# Patient Record
Sex: Female | Born: 1970
Health system: Southern US, Community
[De-identification: ages and names within clinical notes are randomized; demographics above are authoritative.]

## PROBLEM LIST (undated history)

## (undated) ENCOUNTER — Encounter

## (undated) ENCOUNTER — Ambulatory Visit

## (undated) DIAGNOSIS — R51 Headache: Secondary | ICD-10-CM

## (undated) DIAGNOSIS — K900 Celiac disease: Secondary | ICD-10-CM

## (undated) DIAGNOSIS — R519 Headache, unspecified: Secondary | ICD-10-CM

## (undated) DIAGNOSIS — M1990 Unspecified osteoarthritis, unspecified site: Secondary | ICD-10-CM

## (undated) DIAGNOSIS — C439 Malignant melanoma of skin, unspecified: Secondary | ICD-10-CM

## (undated) DIAGNOSIS — E119 Type 2 diabetes mellitus without complications: Secondary | ICD-10-CM

## (undated) DIAGNOSIS — G8929 Other chronic pain: Secondary | ICD-10-CM

## (undated) DIAGNOSIS — K219 Gastro-esophageal reflux disease without esophagitis: Secondary | ICD-10-CM

## (undated) HISTORY — DX: Headache, unspecified: R51.9

## (undated) HISTORY — DX: Other chronic pain: G89.29

## (undated) HISTORY — DX: Gastro-esophageal reflux disease without esophagitis: K21.9

## (undated) HISTORY — PX: LIPOSUCTION: SHX10

## (undated) HISTORY — PX: TUBAL LIGATION: SHX77

## (undated) HISTORY — DX: Unspecified osteoarthritis, unspecified site: M19.90

## (undated) HISTORY — DX: Celiac disease: K90.0

## (undated) HISTORY — DX: Malignant melanoma of skin, unspecified: C43.9

## (undated) HISTORY — DX: Headache: R51

## (undated) HISTORY — PX: BREAST REDUCTION SURGERY: SHX8

---

## 2003-08-19 ENCOUNTER — Ambulatory Visit (HOSPITAL_COMMUNITY): Admission: RE | Admit: 2003-08-19 | Discharge: 2003-08-20 | Payer: Self-pay | Admitting: Specialist

## 2003-08-19 ENCOUNTER — Encounter (INDEPENDENT_AMBULATORY_CARE_PROVIDER_SITE_OTHER): Payer: Self-pay | Admitting: *Deleted

## 2005-06-13 ENCOUNTER — Ambulatory Visit: Payer: Self-pay | Admitting: Family Medicine

## 2005-06-27 ENCOUNTER — Ambulatory Visit: Payer: Self-pay | Admitting: Sports Medicine

## 2005-07-09 ENCOUNTER — Ambulatory Visit: Payer: Self-pay | Admitting: Family Medicine

## 2005-07-23 ENCOUNTER — Ambulatory Visit (HOSPITAL_COMMUNITY): Admission: RE | Admit: 2005-07-23 | Discharge: 2005-07-23 | Payer: Self-pay | Admitting: General Surgery

## 2005-08-02 ENCOUNTER — Encounter: Admission: RE | Admit: 2005-08-02 | Discharge: 2005-08-02 | Payer: Self-pay | Admitting: General Surgery

## 2005-08-05 ENCOUNTER — Encounter (INDEPENDENT_AMBULATORY_CARE_PROVIDER_SITE_OTHER): Payer: Self-pay | Admitting: Specialist

## 2005-08-05 ENCOUNTER — Ambulatory Visit (HOSPITAL_BASED_OUTPATIENT_CLINIC_OR_DEPARTMENT_OTHER): Admission: RE | Admit: 2005-08-05 | Discharge: 2005-08-05 | Payer: Self-pay | Admitting: General Surgery

## 2005-11-09 ENCOUNTER — Encounter: Admission: RE | Admit: 2005-11-09 | Discharge: 2005-11-09 | Payer: Self-pay | Admitting: Internal Medicine

## 2006-02-04 DIAGNOSIS — K900 Celiac disease: Secondary | ICD-10-CM

## 2006-02-04 HISTORY — DX: Celiac disease: K90.0

## 2006-08-27 ENCOUNTER — Encounter: Payer: Self-pay | Admitting: Family Medicine

## 2006-08-27 ENCOUNTER — Telehealth (INDEPENDENT_AMBULATORY_CARE_PROVIDER_SITE_OTHER): Payer: Self-pay | Admitting: *Deleted

## 2006-08-27 ENCOUNTER — Ambulatory Visit: Payer: Self-pay | Admitting: Family Medicine

## 2007-02-06 ENCOUNTER — Encounter: Payer: Self-pay | Admitting: Family Medicine

## 2007-03-22 ENCOUNTER — Encounter: Payer: Self-pay | Admitting: Family Medicine

## 2007-04-20 ENCOUNTER — Ambulatory Visit: Payer: Self-pay | Admitting: Family Medicine

## 2007-04-27 ENCOUNTER — Telehealth: Payer: Self-pay | Admitting: *Deleted

## 2007-04-28 ENCOUNTER — Telehealth: Payer: Self-pay | Admitting: *Deleted

## 2007-05-14 ENCOUNTER — Ambulatory Visit: Payer: Self-pay | Admitting: Family Medicine

## 2007-05-14 DIAGNOSIS — G4700 Insomnia, unspecified: Secondary | ICD-10-CM | POA: Insufficient documentation

## 2007-05-14 DIAGNOSIS — K219 Gastro-esophageal reflux disease without esophagitis: Secondary | ICD-10-CM | POA: Insufficient documentation

## 2007-06-18 ENCOUNTER — Ambulatory Visit: Payer: Self-pay | Admitting: Family Medicine

## 2007-06-18 ENCOUNTER — Encounter: Payer: Self-pay | Admitting: Family Medicine

## 2007-06-18 LAB — CONVERTED CEMR LAB
Cholesterol: 229 mg/dL — ABNORMAL HIGH (ref 0–200)
Creatinine, Ser: 0.65 mg/dL (ref 0.40–1.20)
Glucose, Bld: 117 mg/dL — ABNORMAL HIGH (ref 70–99)
Hemoglobin: 12.2 g/dL (ref 12.0–15.0)
MCHC: 32.3 g/dL (ref 30.0–36.0)
MCV: 91.3 fL (ref 78.0–100.0)
Platelets: 266 10*3/uL (ref 150–400)
Potassium: 4.2 meq/L (ref 3.5–5.3)
RBC: 4.14 M/uL (ref 3.87–5.11)
Sodium: 135 meq/L (ref 135–145)
Total Protein: 7.7 g/dL (ref 6.0–8.3)
WBC: 11.5 10*3/uL — ABNORMAL HIGH (ref 4.0–10.5)

## 2007-07-03 ENCOUNTER — Encounter: Payer: Self-pay | Admitting: Family Medicine

## 2008-08-04 ENCOUNTER — Encounter: Payer: Self-pay | Admitting: Family Medicine

## 2008-08-18 ENCOUNTER — Encounter: Payer: Self-pay | Admitting: Family Medicine

## 2008-10-14 ENCOUNTER — Encounter: Payer: Self-pay | Admitting: Family Medicine

## 2008-10-27 ENCOUNTER — Encounter: Payer: Self-pay | Admitting: Family Medicine

## 2008-10-27 ENCOUNTER — Ambulatory Visit: Payer: Self-pay | Admitting: Family Medicine

## 2008-10-27 LAB — CONVERTED CEMR LAB
Chlamydia, DNA Probe: NEGATIVE
Whiff Test: NEGATIVE

## 2008-10-28 ENCOUNTER — Encounter: Payer: Self-pay | Admitting: Family Medicine

## 2008-11-18 ENCOUNTER — Ambulatory Visit: Payer: Self-pay | Admitting: Family Medicine

## 2008-11-18 ENCOUNTER — Encounter: Payer: Self-pay | Admitting: Family Medicine

## 2008-11-25 ENCOUNTER — Ambulatory Visit: Payer: Self-pay | Admitting: Family Medicine

## 2008-12-12 ENCOUNTER — Encounter: Payer: Self-pay | Admitting: Family Medicine

## 2008-12-16 ENCOUNTER — Encounter: Payer: Self-pay | Admitting: Family Medicine

## 2009-01-03 ENCOUNTER — Encounter: Payer: Self-pay | Admitting: Family Medicine

## 2009-01-19 ENCOUNTER — Ambulatory Visit: Payer: Self-pay | Admitting: Family Medicine

## 2009-01-19 DIAGNOSIS — L851 Acquired keratosis [keratoderma] palmaris et plantaris: Secondary | ICD-10-CM | POA: Insufficient documentation

## 2009-01-19 DIAGNOSIS — J309 Allergic rhinitis, unspecified: Secondary | ICD-10-CM | POA: Insufficient documentation

## 2009-01-19 DIAGNOSIS — R0982 Postnasal drip: Secondary | ICD-10-CM | POA: Insufficient documentation

## 2009-03-16 ENCOUNTER — Ambulatory Visit: Payer: Self-pay | Admitting: Family Medicine

## 2009-03-16 DIAGNOSIS — B9789 Other viral agents as the cause of diseases classified elsewhere: Secondary | ICD-10-CM | POA: Insufficient documentation

## 2009-08-03 ENCOUNTER — Encounter: Payer: Self-pay | Admitting: Family Medicine

## 2009-08-08 ENCOUNTER — Ambulatory Visit: Payer: Self-pay | Admitting: Family Medicine

## 2009-08-08 DIAGNOSIS — R030 Elevated blood-pressure reading, without diagnosis of hypertension: Secondary | ICD-10-CM | POA: Insufficient documentation

## 2009-08-08 DIAGNOSIS — N760 Acute vaginitis: Secondary | ICD-10-CM | POA: Insufficient documentation

## 2009-08-08 LAB — CONVERTED CEMR LAB

## 2009-08-09 LAB — CONVERTED CEMR LAB: GC Probe Amp, Genital: NEGATIVE

## 2010-02-07 ENCOUNTER — Encounter: Payer: Self-pay | Admitting: Family Medicine

## 2010-02-25 ENCOUNTER — Encounter: Payer: Self-pay | Admitting: Internal Medicine

## 2010-03-07 NOTE — Assessment & Plan Note (Signed)
Summary: COUGH/FEVER/KH   Vital Signs:  Patient profile:   40 year old female Height:      63 inches Weight:      171 pounds BMI:     30.40 BSA:     1.81 O2 Sat:      96 % on Room air Temp:     99.7 degrees F Pulse rate:   113 / minute BP sitting:   122 / 79  Vitals Entered By: Jone Baseman CMA (March 16, 2009 9:42 AM)  O2 Flow:  Room air CC: cough and fever x 1 day Pain Assessment Patient in pain? yes     Location: arms and legs Intensity: 5   Primary Care Provider:  Pearlean Brownie MD  CC:  cough and fever x 1 day.  History of Present Illness: Fever Cough  For last 24 hours.  Her son had a similar illness that lasted 5 days.  Yesterday she had fever and body aches today cough and some sputum.  No focal pain or shortness of breath or dysuria or stiff neck or rash.    PMH Has rheumatolgic condition on immunosupressants   ROS - as above PMH - Medications reviewed and updated in medication list.  Smoking Status noted in VS form    Habits & Providers  Alcohol-Tobacco-Diet     Tobacco Status: never  Current Medications (verified): 1)  Temazepam 15 Mg Caps (Temazepam) .Marland Kitchen.. 1 Mouth At Bedtime For Sleep For Occaisional Use Only 2)  Prednisone 5 Mg Tabs (Prednisone) .Marland Kitchen.. 1 Daily By Dr Jimmy Footman 3)  Methotrexate 2.5 Mg Tabs (Methotrexate Sodium) .... 9 Daily By Dr Jimmy Footman 4)  Folic Acid 1 Mg Tabs (Folic Acid) .Marland Kitchen.. 1 Daily By Dr Jimmy Footman 5)  Nasonex 50 Mcg/act Susp (Mometasone Furoate) .Marland Kitchen.. 1-2 Each Nostril Qd  Allergies: No Known Drug Allergies  Physical Exam  General:  Well-developed,well-nourished,in no acute distress; alert,appropriate and cooperative throughout examination Ears:  External ear exam shows no significant lesions or deformities.  Otoscopic examination reveals clear canals, tympanic membranes are intact bilaterally without bulging, retraction, inflammation or discharge. Hearing is grossly normal bilaterally. Nose:  External nasal  examination shows no deformity or inflammation. Nasal mucosa are pink and moist without lesions small amount of clear discharge  Mouth:  Oral mucosa and oropharynx without lesions or exudates.  Teeth in good repair. Lungs:  Normal respiratory effort, chest expands symmetrically. Lungs are clear to auscultation, no crackles or wheezes. Heart:  Normal rate and regular rhythm. S1 and S2 normal without gallop, murmur, click, rub or other extra sounds. Abdomen:  Bowel sounds positive,abdomen soft and non-tender without masses, organomegaly or hernias noted. Skin:  Intact without suspicious lesions or rashes Cervical Nodes:  No lymphadenopathy noted   Impression & Recommendations:  Problem # 1:  VIRAL INFECTION, ACUTE (ICD-079.99)  could easily be influenza.  She has rheumatologic condition on immunosupressants and therefore will treat with Tamiflu.  No signs of any bacterial infection  Orders: [facility]- Est Level  3 ([address])  Complete Medication List: 1)  Temazepam 15 Mg Caps (Temazepam) .Marland Kitchen.. 1 mouth at bedtime for sleep for occaisional use only 2)  Prednisone 5 Mg Tabs (Prednisone) .Marland Kitchen.. 1 daily by dr zieminski 3)  Methotrexate 2.5 Mg Tabs (Methotrexate sodium) .... 9 daily by dr zieminski 4)  Folic Acid 1 Mg Tabs (Folic acid) .Marland Kitchen.. 1 daily by dr zieminski 5)  Nasonex 50 Mcg/act Susp (Mometasone furoate) .Marland Kitchen.. 1-2 each nostril qd 6)  Tamiflu 75 Mg Caps (Oseltamivir  phosphate) .Marland Kitchen.. 1 by mouth two times a day for 5 days  Other Orders: Pulse Oximetry- [facility] [id number])  Patient Instructions: 1)  Come back if not better in one week or if focal pain or if fever > 103  2)  Take tylenol or advill for aches and pains Prescriptions: TAMIFLU 75 MG CAPS (OSELTAMIVIR PHOSPHATE) 1 by mouth two times a day for 5 days  #10 x 0   Entered and Authorized by:   Pearlean Brownie MD   Signed by:   Pearlean Brownie MD on 03/16/2009   Method used:   [facility] to        [facility]  [address] [phone][facility] (retail)       [address]       [place], [place]  [address]       Ph: [phone] or [phone]       Fax: [phone]   RxID:   [id number]    Prevention & Chronic Care Immunizations   Influenza vaccine: Not documented    Tetanus booster: Not documented    Pneumococcal vaccine: Not documented  Other Screening   Pap smear: NEGATIVE FOR INTRAEPITHELIAL LESIONS OR MALIGNANCY.  (10/27/2008)   Pap smear due: 07/2008    Mammogram: Not documented   Smoking status: never  (03/16/2009)  Lipids   Total Cholesterol: 229  (06/18/2007)   LDL: 145  (06/18/2007)   LDL Direct: Not documented   HDL: 49  (06/18/2007)   Triglycerides: 173  (06/18/2007)

## 2010-03-07 NOTE — Assessment & Plan Note (Signed)
Summary: ? sinus inf,tcb   Vital Signs:  Patient profile:   40 year old female Height:      63 inches Weight:      178 pounds BMI:     31.65 Temp:     98.2 degrees F oral BP sitting:   140 / 80  (right arm) Cuff size:   regular  Vitals Entered By: Tessie Fass CMA (January 19, 2009 2:15 PM) CC: sinus pressure and drainage, dry skin Is Patient Diabetic? No Pain Assessment Patient in pain? yes     Location: head Intensity: 6   Primary Care Provider:  Pearlean Brownie MD  CC:  sinus pressure and drainage and dry skin.  History of Present Illness: Pleasant 40 year old female with:  1. Sinus Pressure: Comes and goes since Nov 25. + PMHx allergic rhinitis, taking Benadryl and Pseudophed to treat. Today, she c/o sinus pressure, frontal HA that is pounding (6/10), photophobia, phonophobia, slight blurry vision at times, mild sore throat, throaty cough, and PND. She denies ear pain, double vision, eye pain, CP, chest congestion, SOB, wheeze, chest cough, N/V, fever/chills. She endorses mild diarrhea but states that this is normal with her medications.  2. Dry Skin: Both hands, mild skin irritation - redness, worse during the cold months, she washes her hand often in hot water, uses a [facility] brand lotion. She is followed by Rheum, Rx MTX and Prednisone for vague arthropathy.   Habits & Providers  Alcohol-Tobacco-Diet     Tobacco Status: never  Current Medications (verified): 1)  Temazepam 15 Mg Caps (Temazepam) .Marland Kitchen.. 1 Mouth At Bedtime For Sleep For Occaisional Use Only 2)  Prednisone 5 Mg Tabs (Prednisone) .Marland Kitchen.. 1 Daily By Dr Jimmy Footman 3)  Methotrexate 2.5 Mg Tabs (Methotrexate Sodium) .... 9 Daily By Dr Jimmy Footman 4)  Folic Acid 1 Mg Tabs (Folic Acid) .Marland Kitchen.. 1 Daily By Dr Jimmy Footman 5)  Nasonex 50 Mcg/act Susp (Mometasone Furoate) .Marland Kitchen.. 1-2 Each Nostril Qd 6)  Amoxicillin 500 Mg Tabs (Amoxicillin) .... One By Mouth Two Times A Day X 10 Days 7)  Diflucan 150 Mg Tabs (Fluconazole) ....  Once Daily  Allergies (verified): No Known Drug Allergies  Past History:  Past medical, surgical, family and social histories (including risk factors) reviewed for relevance to current acute and chronic problems.  Past Medical History: Reviewed history from 06/18/2007 and no changes required. colonscopy 10/2005 wnl, EGD 10-29-05 -mild esopghagitis, normal small bowel biopsy - Dr Randa Evens Melanoma dx 06/2005 - Dr Irene Limbo, Wide Excision Dr Abbey Chatters Inflamatory Arthropathy - Dr Norina Buzzard Bilateral breast reduction surgery  Visual Changes work up by Dr Dennie Fetters? sent for record 06/17/07  Family History: Reviewed history from 05/14/2007 and no changes required. Family History Hypertension in her mother Family History Diabetes 1st degree relative in her father  Social History: Reviewed history from 05/14/2007 and no changes required. Lives with sons Christiane Ha 48 and Joshua 96. Not currently employedSmoking Status:  never  Review of Systems       Today, she c/o sinus pressure, frontal HA that is pounding (6/10), photophobia, phonophobia, slight blurry vision at times, mild sore throat, throaty cough, and PND. She denies ear pain, double vision, eye pain, CP, chest congestion, SOB, wheeze, chest cough, N/V, fever/chills. She endorses mild diarrhea but states that this is normal with her medications.  Physical Exam  General:  Well-developed, well-nourished, in no acute distress; alert, appropriate and cooperative throughout examination. Vitals reviewed. Head:  Normocephalic and atraumatic without obvious abnormalities.  Eyes:  No corneal or conjunctival inflammation noted. EOMI. Perrla. Vision grossly normal. Ears:  Otoscopic examination reveals clear canals, tympanic membranes are intact bilaterally without bulging, retraction, inflammation or discharge.  Nose:  Mucosal edema and pallor with thickened nasal discharge, L frontal sinus tenderness, L maxillary sinus tenderness, R frontal sinus  tenderness, and R maxillary sinus tenderness.   Mouth:  + PND with erythema along tracks. No exudates or enlarged tonsils. Neck:  Supple and cervical lymphadenopathy.   Lungs:  Normal respiratory effort, chest expands symmetrically. Lungs are clear to auscultation, no crackles or wheezes. Heart:  Normal rate and regular rhythm. S1 and S2 normal without gallop, murmur, click, rub or other extra sounds. Neurologic:  Cranial nerves II-XII intact.   Skin:  Bilateral hands dry skin with slight redness 2/2 irritation up to middle of forearms. No rash or open lesions. Psych:  Normally interactive, good eye contact.   Impression & Recommendations:  Problem # 1:  RHINOSINUSITIS, ACUTE (ICD-461.8) Assessment New  Rhinosinusitis > 2 weeks in patient on immunosuppressing medications. Will treat with Amoxicillin. Provided education and handout re: diagnosis and symptomatic treatment. Gave Red Flags for follow-up. Will also address rhinitis component or prevention (see below). Her updated medication list for this problem includes:    Nasonex 50 Mcg/act Susp (Mometasone furoate) .Marland Kitchen... 1-2 each nostril qd    Amoxicillin 500 Mg Tabs (Amoxicillin) ..... One by mouth two times a day x 10 days  Orders: [facility]- Est Level  3 ([address])  Problem # 2:  ALLERGIC RHINITIS (ICD-477.9) Assessment: New  Patient stated that Zyrtec and Claritin have NOT worked well in the past. She had good results with Nasonex. Will restart. Okay for patient to use Afrin for next 2-3 days for symtom relief.  Her updated medication list for this problem includes:    Nasonex 50 Mcg/act Susp (Mometasone furoate) .Marland Kitchen... 1-2 each nostril qd  Orders: [facility]- Est Level  3 (60454)  Problem # 3:  POSTNASAL DRIP (ICD-784.91) Assessment: New  See #2.  Orders: [facility]- Est Level  3 (99213)  Problem # 4:  DRY SKIN (ICD-701.1) Assessment: New Advised patient to switch to ointment or heavier cream based formula to protect against dry skin as  lotions may actually make dry skin worse due to the high water content (and evaporation). Wash water in warm water instead of hot. May wear gloves at night for extra rehydration.  Orders: [facility]- Est Level  3 ([address]11)  Complete Medication List: 1)  Temazepam 15 Mg Caps (Temazepam) .Marland Kitchen.. 1 mouth at bedtime for sleep for occaisional use only 2)  Prednisone 5 Mg Tabs (Prednisone) .Marland Kitchen.. 1 daily by dr zieminski 3)  Methotrexate 2.5 Mg Tabs (Methotrexate sodium) .... 9 daily by dr zieminski 4)  Folic Acid 1 Mg Tabs (Folic acid) .Marland Kitchen.. 1 daily by dr zieminski 5)  Nasonex 50 Mcg/act Susp (Mometasone furoate) .Marland Kitchen.. 1-2 each nostril qd 6)  Amoxicillin 500 Mg Tabs (Amoxicillin) .... One by mouth two times a day x 10 days 7)  Diflucan 150 Mg Tabs (Fluconazole) .... Once daily  Patient Instructions: 1)  How is sinusitis treated?  2)  Some sinus infections get better on their own. Some may need to be treated with an antibiotic (a medicine that kills germs). Here are some other things you can do to help a sinus infection:  3)  -Get plenty of rest. When you go to sleep, try lying on the side that is least congested (where you can breathe the best), because lying  down increases nasal congestion.  4)  -Sip hot liquids and drink plenty of fluids.  5)  -Apply moist heat by holding a hot, wet towel against your face or breathe in steam. (If you're inhaling steam, be sure to cover your entire face with a cloth or towel first, so you don't burn yourself.)  6)  -Rinse your nasal passages with salt water to remove excess mucus. Use over-the-counter saline nasal solutions or make your own salt water. Add 1/4 teaspoon of table salt to 1 cup of warm water. Mix well in a clean, empty squeeze bottle. Squirt the salt water into each side of your nose for several minutes three or four times a day.  7)  -Use over-the-counter medicines such as acetaminophen (Tylenol) for pain. Don't use aspirin if you're allergic to it or under age 52.    57)  -Talk with your doctor before using cold remedies. Some cold medicines can make a sinus infection worse by drying out mucous membranes and blocking sinus openings. Other medicines disturb your sleep, make you nervous or raise your blood pressure or pulse.  9)  -If you use a nose spray with a decongestant in it, don't use it for more than three days. If you use it for more than three days, your nasal swelling may get worse when you stop the medicine. Use a short-acting nasal decongestant (brand names: Neo-Synephrine, Afrin 4-Hour), since long-acting kinds (brand names: Dristan 12-hour, Afrin 12-hour) may slow healing.  Prescriptions: DIFLUCAN 150 MG TABS (FLUCONAZOLE) once daily  #1 x 0   Entered and Authorized by:   Helane Rima DO   Signed by:   Helane Rima DO on 01/19/2009   Method used:   [facility]        [facility]  [address] [phone]* (retail)       [address]       [place], [place]  [address]       Ph: [phone] or [phone]       Fax: [phone]   RxID:   [id number] AMOXICILLIN 500 MG TABS (AMOXICILLIN) one by mouth two times a day x 10 days  #20 x 0   Entered and Authorized by:   Helane Rima DO   Signed by:   Helane Rima DO on 01/19/2009   Method used:   [facility]        [facility]  [address] [phone]* (retail)       [address]       [place], [place]  [address]       Ph: [phone] or [phone]       Fax: [phone]   RxID:   [id number] NASONEX 50 MCG/ACT SUSP (MOMETASONE FUROATE) 1-2 each nostril qd  #1 x 3   Entered and Authorized by:   Helane Rima DO   Signed by:   Helane Rima DO on 01/19/2009   Method used:   [facility]        [facility]  [address] [phone]* (retail)       [address]       [place], [place]  [address]       Ph: [phone] or [phone]       Fax: [phone]   RxID:   [id number]   Prevention & Chronic Care Immunizations   Influenza vaccine: Not  documented    Tetanus booster: Not documented    Pneumococcal vaccine: Not documented  Other Screening   Pap smear: NEGATIVE FOR INTRAEPITHELIAL LESIONS OR MALIGNANCY.  (10/27/2008)   Pap smear due: 07/2008   Smoking status: never  (01/19/2009)  Lipids   Total Cholesterol: 229  (06/18/2007)   LDL: 145  (06/18/2007)   LDL Direct: Not documented   HDL: 49  (06/18/2007)   Triglycerides: 173  (06/18/2007)

## 2010-03-07 NOTE — Consult Note (Signed)
[facility]   Imported By: De Nurse 08/15/2009 16:33:56  _____________________________________________________________________  External Attachment:    Type:   Image     Comment:   External Document

## 2010-03-07 NOTE — Assessment & Plan Note (Signed)
Summary: female problem,tcb   Vital Signs:  Patient profile:   40 year old female Weight:      171 pounds BMI:     30.40 Temp:     98.6 degrees F oral Pulse rate:   92 / minute BP sitting:   142 / 88  (left arm)  Vitals Entered By: Jimmy Footman, cma  Serial Vital Signs/Assessments:  Time      Position  BP       Pulse  Resp  Temp     By                     112/70                         Sarah Swaziland MD  CC: vaginal itching and burning x 2wweks.  Is Patient Diabetic? No Pain Assessment Patient in pain? yes     Location: overy Intensity: 3 Type: aching Comments pt states that she has used 3 day monistat which has not helped   Primary Care Provider:  Pearlean Brownie MD  CC:  vaginal itching and burning x 2wweks. Marland Kitchen  History of Present Illness: .  First started having problems 1 and 1/2 weeks ago.  C/o itching, some discharge.  Some discomfort in ovaries.  No dysparunia, but feels some pain in ovaries.  No odor noted. Pt used  1 day of yeast infection treatment without relief, then  3 day of Monistat with some improvement.  Has new partner.  Not using condoms.  s/p BTL.  He reports he has no infections.  SHe would would like to be screened for gc/ca, but feels she does not need screenign for HIV or syphillis. [employer], broadcasting/film/video.   Denies problems with blood pressure.  Was seen by other MD last week and it was 120/80.  Habits & Providers  Alcohol-Tobacco-Diet     Tobacco Status: never  Current Medications (verified): 1)  Temazepam 15 Mg Caps (Temazepam) .Marland Kitchen.. 1 Mouth At Bedtime For Sleep For Occaisional Use Only 2)  Prednisone 5 Mg Tabs (Prednisone) .Marland Kitchen.. 1 Daily By Dr Jimmy Footman 3)  Methotrexate 2.5 Mg Tabs (Methotrexate Sodium) .... 9 Weekly By Dr Jimmy Footman 4)  Folic Acid 1 Mg Tabs (Folic Acid) .Marland Kitchen.. 1 Daily By Dr Jimmy Footman 5)  Fluconazole 150 Mg Tabs (Fluconazole) .... Take 1 By Mouth Now, Repeat in 72 Hours If No Improvement. 6)  Metronidazole 500 Mg Tabs  (Metronidazole) .Marland Kitchen.. 1 By Mouth Two Times A Day For 7 Days  Allergies (verified): No Known Drug Allergies  Past History:  Past medical, surgical, family and social histories (including risk factors) reviewed for relevance to current acute and chronic problems.  Past Medical History: Reviewed history from 06/18/2007 and no changes required. colonscopy 10/2005 wnl, EGD 10-29-05 -mild esopghagitis, normal small bowel biopsy - Dr Randa Evens Melanoma dx 06/2005 - Dr Irene Limbo, Wide Excision Dr Abbey Chatters Inflamatory Arthropathy - Dr Norina Buzzard Bilateral breast reduction surgery  Visual Changes work up by Dr Dennie Fetters? sent for record 06/17/07  Family History: Reviewed history from 05/14/2007 and no changes required. Family History Hypertension in her mother Family History Diabetes 1st degree relative in her father  Social History: Reviewed history from 05/14/2007 and no changes required. Lives with sons Christiane Ha 89 and Joshua 96. works as Company secretary.  Review of Systems       see HPI  Physical Exam  General:  Overweight. in  no acute distress; alert,appropriate and cooperative throughout examination.  Vitals noted. Genitalia:  + increased erythema external genitalia.  + thick discharge in vagina. Normal rugated vagina.  No cervical d/c.  Nl uterus and ovaries with slight adnexal TTP.  No CMT.  GC/CZ swabs done.  Wet prep done.   Impression & Recommendations:  Problem # 1:  VAGINITIS (ICD-616.10) Wet prep negative for yeast, but with evidence of BV.  Pt symptomatic for yeast infection, and may be partially treated with topical agents.  Will rx fluconazole 150, may repeat in 72 hours if needed.  When results of wet prep reviewed, pt also wanted to be treated for BV, so will send rx for metronidazole as well.  F/u if no improvement.  GC/CZ results pending. Her updated medication list for this problem includes:    Metronidazole 500 Mg Tabs (Metronidazole) .Marland Kitchen... 1 by mouth two  times a day for 7 days  Orders: Wet Prep- [facility] [phone]) GC/Chlamydia-[facility] ([id number]) [facility]- Est Level  3 ([address])  Problem # 2:  ELEVATED BLOOD PRESSURE WITHOUT DIAGNOSIS OF HYPERTENSION (ICD-796.2)  Initially slightly elevated.  Repeat BP fine.  No intervention at this time.  Orders: [facility]- Est Level  3 ([id number])  Complete Medication List: 1)  Temazepam 15 Mg Caps (Temazepam) .Marland Kitchen.. 1 mouth at bedtime for sleep for occaisional use only 2)  Prednisone 5 Mg Tabs (Prednisone) .Marland Kitchen.. 1 daily by dr zieminski 3)  Methotrexate 2.5 Mg Tabs (Methotrexate sodium) .... 9 weekly by dr zieminski 4)  Folic Acid 1 Mg Tabs (Folic acid) .Marland Kitchen.. 1 daily by dr zieminski 5)  Fluconazole 150 Mg Tabs (Fluconazole) .... Take 1 by mouth now, repeat in 72 hours if no improvement. 6)  Metronidazole 500 Mg Tabs (Metronidazole) .Marland Kitchen.. 1 by mouth two times a day for 7 days Prescriptions: METRONIDAZOLE 500 MG TABS (METRONIDAZOLE) 1 by mouth two times a day for 7 days  #14 x 0   Entered and Authorized by:   Sarah Swaziland MD   Signed by:   Sarah Swaziland MD on 08/08/2009   Method used:   [facility]        [facility]  [address] [phone]* (retail)       [address]       [place], [place]  [address]       Ph: [phone] or [phone]       Fax: [phone]   RxID:   [id number] FLUCONAZOLE 150 MG TABS (FLUCONAZOLE) Take 1 by mouth now, repeat in 72 hours if no improvement.  #2 x 0   Entered and Authorized by:   Sarah Swaziland MD   Signed by:   Sarah Swaziland MD on 08/08/2009   Method used:   [facility]  [address] [phone]* (retail)       [address]       [place], [place]  [address]       Ph: [phone] or [phone]       Fax: [phone]   RxID:   [id number]   Laboratory Results  Date/Time Received: August 08, 2009 12:12 PM  Date/Time Reported: August 08, 2009 12:23 PM   [employer] Source: vaginal WBC/hpf: rare Bacteria/hpf: 3+  Cocci Clue cells/hpf: few   Positive whiff Yeast/hpf: none Trichomonas/hpf: none Comments: Rod bacteria also present ...........test performed by...........Marland KitchenTerese Door, CMA

## 2010-03-08 NOTE — Consult Note (Signed)
[facility]   Imported By: De Nurse 02/19/2010 14:31:26  _____________________________________________________________________  External Attachment:    Type:   Image     Comment:   External Document

## 2010-06-04 ENCOUNTER — Ambulatory Visit (INDEPENDENT_AMBULATORY_CARE_PROVIDER_SITE_OTHER): Payer: BC Managed Care – PPO | Admitting: Family Medicine

## 2010-06-04 ENCOUNTER — Encounter: Payer: Self-pay | Admitting: Family Medicine

## 2010-06-04 ENCOUNTER — Other Ambulatory Visit (HOSPITAL_COMMUNITY)
Admission: RE | Admit: 2010-06-04 | Discharge: 2010-06-04 | Disposition: A | Discharge status: Home / Self Care | Payer: BC Managed Care – PPO | Source: Ambulatory Visit | Attending: Family Medicine | Admitting: Family Medicine

## 2010-06-04 VITALS — BP 156/92 | HR 73 | Temp 98.1°F | Ht 64.0 in | Wt 174.0 lb

## 2010-06-04 DIAGNOSIS — Z1159 Encounter for screening for other viral diseases: Secondary | ICD-10-CM

## 2010-06-04 DIAGNOSIS — Z23 Encounter for immunization: Secondary | ICD-10-CM

## 2010-06-04 DIAGNOSIS — Z124 Encounter for screening for malignant neoplasm of cervix: Secondary | ICD-10-CM

## 2010-06-04 DIAGNOSIS — R0602 Shortness of breath: Secondary | ICD-10-CM

## 2010-06-04 DIAGNOSIS — Z01419 Encounter for gynecological examination (general) (routine) without abnormal findings: Secondary | ICD-10-CM | POA: Insufficient documentation

## 2010-06-04 DIAGNOSIS — Z111 Encounter for screening for respiratory tuberculosis: Secondary | ICD-10-CM

## 2010-06-04 DIAGNOSIS — R030 Elevated blood-pressure reading, without diagnosis of hypertension: Secondary | ICD-10-CM

## 2010-06-04 DIAGNOSIS — Z0000 Encounter for general adult medical examination without abnormal findings: Secondary | ICD-10-CM

## 2010-06-04 MED ORDER — IPRATROPIUM BROMIDE 0.02 % IN SOLN: 0.5000 mg | RESPIRATORY_TRACT | Status: DC

## 2010-06-04 MED ORDER — ALBUTEROL SULFATE (2.5 MG/3ML) 0.083% IN NEBU: 2.5000 mg | INHALATION_SOLUTION | RESPIRATORY_TRACT | Status: DC

## 2010-06-04 NOTE — Assessment & Plan Note (Signed)
Mildly high today.  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  Relates it is usually well controlled at her rheumatologist office.  Will monitor

## 2010-06-04 NOTE — Patient Instructions (Signed)
I will call you if your tests are not good.  Otherwise I will send you a letter.  If you do not hear from me with in 2 weeks please call our office.     You will need your next Hep B shot in 1 month  Preventive Health Care: Exercise  30-45  minutes a day, 3-4 days a week. Walking is especially valuable in preventing Osteoporosis. Eat a low-fat diet with lots of fruits and vegetables, up to 7-9 servings per day.

## 2010-06-04 NOTE — Progress Notes (Signed)
  Subjective:    Patient ID: Natalie Brady, female    DOB: 06/21/1970, 40 y.o.   MRN: [id number]  HPI  Here for well woman exam.  No complaints.   Review of Systems Patient reports no vision/ hearing  changes, adenopathy,fever, weight change,  persistant / recurrent hoarseness , swallowing issues, chest pain,palpitations,edema,persistant /recurrent cough, hemoptysis, dyspnea( rest/ exertional/paroxysmal nocturnal), gastrointestinal bleeding(melena, rectal bleeding), abdominal pain, significant heartburn bowel changes,GU symptoms(dysuria, hematuria,pyuria, incontinence) ), Gyn symptoms(abnormal  bleeding , pain),  syncope, focal weakness, memory loss,numbness & tingling, skin/hair /nail changes,abnormal bruising or bleeding, anxiety,or depression.      Objective:   Physical Exam    Neck:  No deformities, thyromegaly, masses, or tenderness noted.   Supple with full range of motion without pain. Heart - Regular rate and rhythm.  No murmurs, gallops or rubs.    Lungs:  Normal respiratory effort, chest expands symmetrically. Lungs are clear to auscultation, no crackles or wheezes. Abdomen: soft and non-tender without masses, organomegaly or hernias noted.  No guarding or rebound Breasts: breasts appear normal, no suspicious masses, no skin or nipple changes or axillary nodes Extremities:  No cyanosis, edema, or deformity noted with good range of motion of all major joints.   Genitalia:  Normal introitus for age, no external lesions, no vaginal discharge, mucosa pink and moist, no vaginal or cervical lesions, no vaginal atrophy, no friaility or hemorrhage, normal uterus size and position, no adnexal masses or tenderness     Assessment & Plan:   Normal exam.   Discussed diet and exercise   She will be starting ENT classes therefore will get her immunizations and TB status current

## 2010-06-05 LAB — VARICELLA ZOSTER ANTIBODY, IGG: Varicella IgG: 0.56 {ISR}

## 2010-06-06 ENCOUNTER — Ambulatory Visit (INDEPENDENT_AMBULATORY_CARE_PROVIDER_SITE_OTHER): Payer: BC Managed Care – PPO | Admitting: *Deleted

## 2010-06-06 DIAGNOSIS — Z111 Encounter for screening for respiratory tuberculosis: Secondary | ICD-10-CM

## 2010-06-06 LAB — TB SKIN TEST: TB Skin Test: NEGATIVE mm

## 2010-06-06 NOTE — Progress Notes (Signed)
PPD negative-0 mm. 

## 2010-06-22 NOTE — Op Note (Signed)
NAMEMACKIE, GOON                            ACCOUNT NO.:  [id number]   MEDICAL RECORD NO.:  [id number]                   PATIENT TYPE:  OIB   LOCATION:  2550                                 FACILITY:  [facility]   PHYSICIAN:  Earvin Hansen L. Shon Hough, M.D.           DATE OF BIRTH:  29-May-1970   DATE OF PROCEDURE:  08/19/2003  DATE OF DISCHARGE:                                 OPERATIVE REPORT   PREOPERATIVE DIAGNOSIS:  Severe abdominal dermatochalasis with diastasis  recti and lipodystrophy.   POSTOPERATIVE DIAGNOSIS:  Severe abdominal dermatochalasis with diastasis  recti and lipodystrophy.   PROCEDURE:  Abdominoplasty with repair of severe diastasis recti,  liposuction to lipodystrophy areas of the right and left hip and thigh  areas.  The patient has lost approximately 60 pounds in the past and now has  increased sagging of the skin with intertriginous changes.   SURGEON:  Yaakov Guthrie. Shon Hough, M.D.   ANESTHESIA:  General anesthesia.   DESCRIPTION OF PROCEDURE:  Preoperatively, the patient was sat up and drawn  for the W-plasty incisions, then underwent general anesthesia and intubated  orally.  Prep was done to the abdominal, groin and thigh areas using  Hibiclens soaping solution and walled off with sterile towels and drapes so  as to make a sterile field.  A Foley catheter was placed sterilely.  After  this, 0.25% Xylocaine with epinephrine was injected locally, 150 mL to the  incision areas.  The incision was made across the W-plasty incision area  with #15 blade and carried down through the skin and subcutaneous tissue and  then using the Bovie unit on coagulation, we were able to dissect down  through Scarpa's fascia to underlying  __________ tissue overlying the  fascia.  Dissection was then carried up to the umbilicus.  The umbilicus was  then released on its pedicle and then the dissection was carried all the way  up to the xiphoid process as well as the right and left upper  quadrant  areas.  Hemostasis was again maintained with the Bovie unit on coagulation.  After this, the severe diastasis was plicated and repaired with a running #1  Prolene suture from the xiphoid process to the umbilicus and another one  from the umbilicus down to the suprapubic area.  The patient was then placed  in a jackknife position.  Large amounts of skin were removed, weighing over  4 pounds on each side, and then after proper hemostasis, the flaps were then  brought back into their anatomical locations and secured with 2-0 Monocryl  x2 layers and a running subcuticular stitch of 3-0 Monocryl.  An new opening  was made for the belly button and the belly button was brought back through  and then the area was then repaired with 2-0 Monocryl in the subcutaneous  plane x2 levels and then a running subcuticular stitch of 3-0 Monocryl  throughout the whole area.  The wound was drained with a large Hemovac  drain, which was placed in the depths of the wound and brought through the  pubic area and secured with 3-0 Prolene.  The wounds were cleansed, half-  inch Steri-Strips applied to all the areas, including Xeroform gauze, 4 x  4's, ABDs, Hypafix tape and abdominal binder support.  She withstood the  procedures very well and was taken to recovery in excellent condition.   ESTIMATED BLOOD LOSS:  150 mL for this portion.   COMPLICATIONS:  None.                                              Yaakov Guthrie. Shon Hough, M.D.   Cathie Hoops  D:  08/19/2003  T:  08/19/2003  Job:  [id number]

## 2010-06-22 NOTE — Op Note (Signed)
NAMEARLETA, OSTRUM                ACCOUNT NO.:  [id number]   MEDICAL RECORD NO.:  [id number]          PATIENT TYPE:  AMB   LOCATION:  DSC                          FACILITY:  [facility]   PHYSICIAN:  Adolph Pollack, M.D.DATE OF BIRTH:  08-02-1970   DATE OF PROCEDURE:  08/05/2005  DATE OF DISCHARGE:                                 OPERATIVE REPORT   PREOPERATIVE DIAGNOSIS:  Left shoulder melanoma.   POSTOPERATIVE DIAGNOSIS:  Left shoulder melanoma.   PROCEDURE:  1.  Blue dye injection.  2.  Left axillary sentinel lymph node biopsy.  3.  Wide local excision left shoulder melanoma.   SURGEON:  Adolph Pollack, M.D.   ANESTHESIA:  General plus 0.5% plain Marcaine for local effect.   INDICATIONS:  This is a 40 year old female who had a change in the left  shoulder mole.  It was excised and a superficial spreading melanoma with at  least a Clark's level 2 involvement was noted.  It was approximately 0.95 mm  however, slight tangential artifact was present so they were not sure that  this was the true thickness.  She now presents for the above procedure.  We  have discussed the procedure and the risks preoperatively.   TECHNIQUE:  She is seen in the holding area and left shoulder marked with my  initials.  She had previously had injection with technetium sulfur colloid  intradermally in the left shoulder.  She was brought to the operating room,  placed supine on the operating table and general anesthetic was  administered.  The left axillary area and shoulder were sterilely prepped  and draped.  Preoperative lymphoscintigraphy had demonstrated findings  compatible with a sentinel node in the left anterior axillary region.  Local  anesthetic was then infiltrated in the left anterior region and in the  anterior axillary line. An incision was made beginning in the anterior  axillary line and extending posteriorly and slightly anteriorly.  Blue dye  injection was performed with 1.5 mL  of Lymphazurin blue in four quadrants  around the previous excision site in the left shoulder.  The subcutaneous  tissue was dissected.  Using the NeoProbe, initially anteriorly I did not  identify anything except some scatter artifact from the injection site as  the areas were in fairly close proximity.  However deep in an anterior  axillary line, I began beating slightly increased counts.  I continued my  dissection deep and found a lymph node that was hot but not blue. This was  excised as well as the surrounding lymph node with it.  It was sent to  pathology.   Hemostasis was controlled with electrocautery.  No other hot spots in the  axilla were noted.  Once hemostasis was adequate, I loosely approximated the  subcutaneous tissue in the axilla with a running 3-0 Vicryl stitch.  The  skin was closed with a 4-0 Monocryl subcuticular stitch.  Steri-Strips were  applied.   Following this, I addressed the left shoulder area.  2 cm were marked from  the previous excision site.  I made a longitudinal  type incision elliptical  shape through full-thickness skin, subcutaneous tissue down to the fascia  using the scalpel and electrocautery and then excised this area using  electrocautery and sent it to pathology.  The bleeding was controlled with  electrocautery.  I did inject some local anesthetic into the skin, dermis  and subcutaneous tissues.  When hemostasis was adequate, I was able to  approximate this in a single layer using interrupted 3-0 nylon sutures.  Neosporin and a sterile dressing were applied to this wound and a sterile  dressing was applied to the left axillary wound.   She tolerated both procedures well without any apparent complications.  She  subsequently was taken to the recovery room in satisfactory condition.      Adolph Pollack, M.D.  Electronically Signed     TJR/MEDQ  D:  08/05/2005  T:  08/05/2005  Job:  [id number]

## 2010-06-22 NOTE — Op Note (Signed)
NAMECORY, Natalie Brady                            ACCOUNT NO.:  [id number]   MEDICAL RECORD NO.:  [id number]                   PATIENT TYPE:  OIB   LOCATION:  2550                                 FACILITY:  [facility]   PHYSICIAN:  Earvin Hansen L. Shon Hough, M.D.           DATE OF BIRTH:  04-22-1970   DATE OF PROCEDURE:  08/19/2003  DATE OF DISCHARGE:                                 OPERATIVE REPORT   INDICATION:  [age]-year-old lady who, on today, August 19, 2003, is  undergoing bilateral breast reductions.  The patient has increased back and  shoulder pain secondary to her large pendulous breasts and intertriginous  changes resistant to conservative treatment for over 6-8 months including  salves, ointments and talcs.  She also has worn a special bra to shoulder  areas to support that without success.  She is now being prepared for  bilateral breast reductions using the inferior pedicle technique.   PROCEDURE:  Inferior pedicle reduction mammoplasty.   SURGEON:  Yaakov Guthrie. Shon Hough, M.D.   ANESTHESIA:  General.   DESCRIPTION OF PROCEDURE:  Preoperatively, the patient was sat up and drawn  for the inferior pedicle reduction mammoplasty, remarking the nipple-areolar  complexes back to 20 cm from the suprasternal notch.  She underwent drawings  to the areas.  Next, general anesthesia was administered.  She underwent  prep with Hibiclens soaping solution and walled off with sterile towels and  drapes so as to make a sterile field.  Xylocaine 0.25% in 1:400,000  concentration was injected locally within the breast areas, a total of 150  mL per side.  After this, the wounds were scored with #15 blade.  The skin  over the inferior pedicles was de-epithelialized with #20 blades.  Medial  and lateral fatty dermal pedicles were excised down to underlying fascia  after proper hemostasis.  The keyhole areas were also debulked.  The lateral  areas had other excess accessory tissue removed and after  proper hemostasis,  the flaps were transposed and stayed with 3-0 Prolene.  Subcutaneous closure  was done with 2-0 Monocryl x2 layers and then the skin edges were  reapproximated with running subcuticular stitches of 3-0 Monocryl and 5-0  Monocryl throughout the inverted T.  The wounds were drained with #10 Blake  drains, which were placed in the depths of the wound and brought out through  the lateral-most portion of the incision and secured with 3-0 Prolene.  The  wounds were cleansed, Steri-Strips and soft dressings applied to all the  areas, including Xeroform, 4 x 4's, ABDs and Hypafix tape.  She withstood  the procedure very well and was taken to recovery in excellent condition.   ESTIMATED BLOOD LOSS:  Less than 150 mL.   COMPLICATIONS:  None.  Yaakov Guthrie. Shon Hough, M.D.    Cathie Hoops  D:  08/19/2003  T:  08/19/2003  Job:  [id number]

## 2010-07-03 ENCOUNTER — Ambulatory Visit (INDEPENDENT_AMBULATORY_CARE_PROVIDER_SITE_OTHER): Payer: BC Managed Care – PPO | Admitting: *Deleted

## 2010-07-03 DIAGNOSIS — Z23 Encounter for immunization: Secondary | ICD-10-CM

## 2010-07-03 MED ORDER — VARICELLA VIRUS VACCINE LIVE ~~LOC~~ INJ: 0.5000 mL | INJECTION | Freq: Once | SUBCUTANEOUS | Status: DC

## 2010-07-03 NOTE — Progress Notes (Signed)
Consulted Dr. Deirdre Priest regarding prednisone use and giving Varicella today and he advises patient is on low dose prednisone and will be fine to give Varicella vaccine today.

## 2010-09-24 ENCOUNTER — Encounter: Payer: Self-pay | Admitting: Gastroenterology

## 2010-10-23 ENCOUNTER — Encounter: Payer: Self-pay | Admitting: Gastroenterology

## 2010-10-23 ENCOUNTER — Ambulatory Visit (INDEPENDENT_AMBULATORY_CARE_PROVIDER_SITE_OTHER): Payer: BC Managed Care – PPO | Admitting: *Deleted

## 2010-10-23 ENCOUNTER — Other Ambulatory Visit (INDEPENDENT_AMBULATORY_CARE_PROVIDER_SITE_OTHER): Payer: BC Managed Care – PPO

## 2010-10-23 ENCOUNTER — Ambulatory Visit (INDEPENDENT_AMBULATORY_CARE_PROVIDER_SITE_OTHER): Payer: BC Managed Care – PPO | Admitting: Gastroenterology

## 2010-10-23 DIAGNOSIS — R109 Unspecified abdominal pain: Secondary | ICD-10-CM

## 2010-10-23 DIAGNOSIS — K900 Celiac disease: Secondary | ICD-10-CM

## 2010-10-23 DIAGNOSIS — Z23 Encounter for immunization: Secondary | ICD-10-CM

## 2010-10-23 NOTE — Patient Instructions (Signed)
You will have labs done today in our basement lab. You will start a gluten free diet after labs today and then repeat the celiac panel in 6 weeks (October 29) .   You do not need an appointment the lab is open from 730 am to 5 pm Monday - Friday. We will send for your records from Dr Randa Evens.

## 2010-10-23 NOTE — Progress Notes (Signed)
HPI: This is a   very pleasant 40 year old woman who is here with her husband today. She was told that she had celiac sprue about 4 years ago. This was based on blood work done by her rheumatologist as well as biopsies of her upper intestine by Dr. Randa Evens from [facility] gastroenterology. She also had a colonoscopy in 2008. I do not have any of those results at the time of this visit.  She believes she avoids most gluten in her diet but admits she eats half the times, she knows that gluten does get into her diet periodically. She has never sat down with a dietitian.  She is bothered by postprandial bloating, abdominal discomfort, belching, loose stools. Some of the discomfort during her right upper quadrant, she can push in that area and then will have belching. She tells me she ultrasound 2 years ago and was negative for gallstones.    Review of systems: Pertinent positive and negative review of systems were noted in the above HPI section.  All other review of systems was otherwise negative.   Past Medical History  Diagnosis Date  . Melanoma     2005 surgery   . Arthritis   . Chronic headaches   . GERD (gastroesophageal reflux disease)   . Celiac disease 2008    Past Surgical History  Procedure Date  . Cesarean section     93 96  . Breast reduction surgery   . Liposuction      reports that she has never smoked. She has never used smokeless tobacco. She reports that she does not drink alcohol or use illicit drugs.  family history includes Coronary artery disease in her maternal grandmother and paternal grandmother; Diabetes in her father and mother; Hypertension in her mother; and Stomach cancer in her paternal grandfather.    Current Medications, Allergies were all reviewed with the patient via [facility] electronic medical record system.    Physical Exam: BP 148/88  Pulse 80  Ht 5\' 4"  (1.626 m)  Wt 170 lb 3.2 oz (77.202 kg)  BMI 29.21 kg/m2  LMP  10/12/2010 Constitutional: generally well-appearing Psychiatric: alert and oriented x3 Eyes: extraocular movements intact Mouth: oral pharynx moist, no lesions Neck: supple no lymphadenopathy Cardiovascular: heart regular rate and rhythm Lungs: clear to auscultation bilaterally Abdomen: soft, nontender, nondistended, no obvious ascites, no peritoneal signs, normal bowel sounds Extremities: no lower extremity edema bilaterally Skin: no lesions on visible extremities    Assessment and plan: 40 y.o. female with  celiac sprue, current symptoms consistent with celiac sprue  Most likely her symptoms are celiac sprue related. We will get records sent from her previous gastroenterologist to evaluate that workup. She will get a celiac panel today as well as total IgA level. She will then try to absolutely avoid all gluten in her diet for about 6 weeks and we will repeat a celiac panel. 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

## 2010-10-25 ENCOUNTER — Other Ambulatory Visit: Payer: BC Managed Care – PPO

## 2010-10-25 LAB — CELIAC PANEL 10
Endomysial Screen: POSITIVE — AB
Gliadin IgA: 0.1 U/mL (ref <20)
Gliadin IgG: 9.6 U/mL (ref <20)
IgA: 26 mg/dL — ABNORMAL LOW (ref 69–380)
Tissue Transglut Ab: 10.8 U/mL (ref <20)

## 2010-10-25 LAB — IGA: IgA: <30 mg/dL — ABNORMAL LOW (ref 68–378)

## 2010-10-25 LAB — ENDOMYSIAL AB IGA TITER: Endomysial Titer: 1:20 {titer} — ABNORMAL HIGH

## 2010-10-26 LAB — GIARDIA/CRYPTOSPORIDIUM (EIA)
Cryptosporidium Screen (EIA): NEGATIVE
Giardia Screen (EIA): NEGATIVE

## 2010-10-30 ENCOUNTER — Other Ambulatory Visit: Payer: Self-pay | Admitting: Gastroenterology

## 2010-10-30 ENCOUNTER — Telehealth: Payer: Self-pay | Admitting: Gastroenterology

## 2010-10-30 DIAGNOSIS — K900 Celiac disease: Secondary | ICD-10-CM

## 2010-10-30 DIAGNOSIS — R197 Diarrhea, unspecified: Secondary | ICD-10-CM

## 2010-10-30 NOTE — Telephone Encounter (Signed)
Received copies from Dr. Randa Evens @ [facility],on 10/30/2010. Forwarded  7pages to Dr. Georgann Housekeeper review.

## 2010-11-12 ENCOUNTER — Telehealth: Payer: Self-pay | Admitting: Gastroenterology

## 2010-11-12 NOTE — Telephone Encounter (Signed)
[facility] EGD 11/2005 Dr. Randa Evens: "a few erosions in lower third of esophagus" otherwise normal.  Duodenum biopsied. Colonoscopy 10/2005 Dr. Randa Evens: "internal medium sized hemorrhoids" otherwise normal exmination to cecum.  Patty, Can you call [facility].  We need the biopsy reports from her EGD 2007.

## 2010-11-14 NOTE — Telephone Encounter (Signed)
Called for the path report she will fax

## 2010-11-15 NOTE — Telephone Encounter (Signed)
Results faxed and put on Dr Christella Hartigan desk

## 2010-11-20 ENCOUNTER — Telehealth: Payer: Self-pay | Admitting: Gastroenterology

## 2010-11-20 NOTE — Telephone Encounter (Signed)
Biopsies from duodenum, October 2007 by Dr. Vilinda Boehringer showed normal small bowel mucosa. No sign of celiac sprue  I am interested to see how her celiac sprue antibodies change with her complete avoidance of gluten.

## 2010-12-04 ENCOUNTER — Telehealth: Payer: Self-pay

## 2010-12-04 NOTE — Telephone Encounter (Signed)
Message copied by Donata Duff on Tue Dec 04, 2010 11:36 AM ------      Message from: Donata Duff      Created: Tue Oct 23, 2010 12:32 PM       Pt to have labs

## 2010-12-04 NOTE — Telephone Encounter (Signed)
Pt reminded to have labs done.

## 2010-12-24 ENCOUNTER — Telehealth: Payer: Self-pay

## 2010-12-24 NOTE — Telephone Encounter (Signed)
Pt notified to have labs 

## 2010-12-24 NOTE — Telephone Encounter (Signed)
Message copied by Donata Duff on Mon Dec 24, 2010  2:51 PM ------      Message from: Donata Duff      Created: Tue Oct 30, 2010  8:29 AM       PT TO GET LABS

## 2010-12-26 ENCOUNTER — Ambulatory Visit: Payer: BC Managed Care – PPO

## 2010-12-26 ENCOUNTER — Other Ambulatory Visit: Payer: Self-pay | Admitting: *Deleted

## 2010-12-26 DIAGNOSIS — K900 Celiac disease: Secondary | ICD-10-CM

## 2010-12-31 LAB — GLIA (IGA/G) + TTG IGA: Gliadin IgG: 6.2 U/mL (ref <20)

## 2011-01-03 ENCOUNTER — Other Ambulatory Visit: Payer: Self-pay | Admitting: Gastroenterology

## 2011-01-03 MED ORDER — METRONIDAZOLE 500 MG PO TABS: 500.0000 mg | ORAL_TABLET | Freq: Three times a day (TID) | ORAL | Status: AC

## 2011-01-08 ENCOUNTER — Ambulatory Visit: Payer: BC Managed Care – PPO | Admitting: *Deleted

## 2011-01-08 ENCOUNTER — Telehealth: Payer: Self-pay | Admitting: Family Medicine

## 2011-01-08 DIAGNOSIS — Z23 Encounter for immunization: Secondary | ICD-10-CM

## 2011-01-08 MED ORDER — HEPATITIS B VAC RECOMBINANT 20 MCG/ML IM INJ: 20.0000 ug | INJECTION | Freq: Once | INTRAMUSCULAR | Status: DC

## 2011-01-08 NOTE — Telephone Encounter (Signed)
Pt asking to speak with RN re: her immunizations, she is needing one but not sure which and cannot find her shot record.

## 2011-01-08 NOTE — Telephone Encounter (Signed)
Returned call to patient.  Due for 3rd Hep B injection.  Appt scheduled for today with nurse clinic.  Gaylene Brooks, RN

## 2011-01-16 ENCOUNTER — Ambulatory Visit: Payer: BC Managed Care – PPO | Admitting: Gastroenterology

## 2011-07-31 ENCOUNTER — Ambulatory Visit (HOSPITAL_COMMUNITY)
Admission: RE | Admit: 2011-07-31 | Discharge: 2011-07-31 | Disposition: A | Discharge status: Home / Self Care | Payer: BC Managed Care – PPO | Source: Ambulatory Visit | Attending: Family Medicine | Admitting: Family Medicine

## 2011-07-31 ENCOUNTER — Other Ambulatory Visit (HOSPITAL_COMMUNITY)
Admission: RE | Admit: 2011-07-31 | Discharge: 2011-07-31 | Disposition: A | Discharge status: Home / Self Care | Payer: BC Managed Care – PPO | Source: Ambulatory Visit | Attending: Family Medicine | Admitting: Family Medicine

## 2011-07-31 ENCOUNTER — Encounter: Payer: Self-pay | Admitting: Family Medicine

## 2011-07-31 ENCOUNTER — Ambulatory Visit (INDEPENDENT_AMBULATORY_CARE_PROVIDER_SITE_OTHER): Payer: BC Managed Care – PPO | Admitting: Family Medicine

## 2011-07-31 VITALS — BP 149/88 | HR 91 | Temp 98.8°F | Ht 63.75 in | Wt 169.0 lb

## 2011-07-31 DIAGNOSIS — N898 Other specified noninflammatory disorders of vagina: Secondary | ICD-10-CM | POA: Insufficient documentation

## 2011-07-31 DIAGNOSIS — R079 Chest pain, unspecified: Secondary | ICD-10-CM | POA: Insufficient documentation

## 2011-07-31 DIAGNOSIS — Z113 Encounter for screening for infections with a predominantly sexual mode of transmission: Secondary | ICD-10-CM | POA: Insufficient documentation

## 2011-07-31 DIAGNOSIS — N760 Acute vaginitis: Secondary | ICD-10-CM

## 2011-07-31 LAB — POCT WET PREP (WET MOUNT): Clue Cells Wet Prep Whiff POC: NEGATIVE

## 2011-07-31 MED ORDER — NITROGLYCERIN 0.4 MG SL SUBL: SUBLINGUAL_TABLET | SUBLINGUAL | Status: DC

## 2011-07-31 MED ORDER — PRAVASTATIN SODIUM 40 MG PO TABS: 40.0000 mg | ORAL_TABLET | Freq: Every day | ORAL | Status: DC

## 2011-07-31 NOTE — Patient Instructions (Addendum)
Start pravachol  Start aspirin 81 mg  See Dr. Deirdre Priest in a few weeks to discuss a possible referral to cardiology Be seen sooner if you have another chest pain episode   Angina Angina is discomfort caused by inadequate oxygen delivery to the heart muscle. Angina is a response to blockage or narrowing of the coronary arteries. It alerts you about a blood flow problem to your heart. New, more frequent, or severe angina is a warning signal for you to seek medical care right away.   CAUSES The coronary arteries supply blood and oxygen to the heart muscle. The heart muscle needs a constant supply of oxygen in order to pump blood to the body. These arteries often become blocked by hardened deposits of blood products (plaque) including fat and cholesterol. The following contribute to the formation of plaque:  High cholesterol.   High blood pressure.   Smoking.   Obesity.   Diabetes.  SYMPTOMS    The most common problem is a deep discomfort in the center of your chest. The discomfort may also be felt in, or move to your:   Arms (especially the left one).   Throat.   Jaw.   Back.   Upper stomach.   Angina may be brought on by exercise, emotional upset, heavy meals, or extremes of heat or cold.   It may resolve within 5 to 10 minutes after a period of rest.   Angina symptoms vary from person to person.   If you have angina and it is treated, it may resolve. If you feel it again, the symptoms may not be the same in both type and location.  DIAGNOSIS    Emergency room evaluation or hospital admission may be needed to determine if there are any blockages of your coronary arteries.   Blood tests, EKGs, and chest X-rays may be done.   Further testing may include a stress test or an angiogram.   A heart specialist (cardiologist) may be asked to assist with your evaluation.   Other conditions that may feel like angina, but are not a problem with the heart include:   Muscle  strain in the chest wall.   Blood clots in the lung.   Anxiety.   Acid reflux from the stomach.  RISKS AND COMPLICATIONS   Angina that is not treated or evaluated can lead to a decline in oxygen delivery to the heart muscle, a heart attack, or even death. TREATMENT   Depending on severity of the blockages and on other factors, angina may be treated with:  Lifestyle changes such as weight loss, stopping smoking, appropriate exercise, or low cholesterol and low salt diet.   Medications to control or to treat the risk factors for angina.   Procedures such as angioplasty or stent placement may allow the blockages to be opened without surgery.   Open heart surgery may be needed to bypass blocked arteries that cannot be treated by other methods.  HOME CARE INSTRUCTIONS    If your caregiver prescribed medication to control your angina, take them as directed. Report side effects. Do not stop medications or adjust the dosages on your own.   Regular exercise is good for you as long as it does not cause discomfort. Avoid activities that trigger attacks of angina. Walking is the best exercise. Do not begin any new type of exercise until you check with your caregiver.   You may still have a sexual relationship if it does not cause angina. Tell your caregiver  if it does.   Stop smoking. Do not use nicotine patches or gum until you check with your caregiver.   If you are overweight, you should lose weight. Eat a heart healthy diet that is low in fat and salt.   If your caregiver has given you a follow-up appointment, it is very important to keep that appointment. Not keeping the appointment could result in a heart attack, permanent heart damage, and disability. If there is any problem keeping the appointment, you must call back to this facility for assistance.  SEEK MEDICAL CARE IF:    Your angina seems to be occurring more frequently or seems to be lasting longer.   You are having problems that  you think may be side effects of the medicine you are taking.  SEEK IMMEDIATE MEDICAL CARE IF:    You have severe chest discomfort, especially if the pain is crushing or pressure-like and spreads to the arms, back, neck, or jaw.   You are sweating, feel sick to your stomach (nauseous), or have shortness of breath.   You have an attack of angina that does not get better after rest or taking your usual medicine.   You wake from sleep with chest pain.   You feel dizzy, faint, or experience extreme fatigue.   You have chest pain not typical of your usual angina. THIS IS AN EMERGENCY. Do not wait to see if the pain will go away. Get medical help at once. Call 911. DO NOT drive yourself to the hospital.  MAKE SURE YOU:    Understand these instructions.   Will watch your condition.   Will get help right away if you are not doing well or get worse.  Document Released: 01/21/2005 Document Revised: 01/10/2011 Document Reviewed: 08/25/2007 [facility] Patient Information 2012 [place], [place].

## 2011-07-31 NOTE — Assessment & Plan Note (Addendum)
Release of information signed for labs performed at [facility] medical last month.  EKG today showed NSR with no abnormalities.  One episode of exertional CP.  Usually can exercise without pain, but limited by knee pain.  Risk factors include elevated BP and hyperlipidemia.  Will start pravachol and ASA and give nitro.  Pt desires to wait for referal to cards until discussion with Dr. Deirdre Priest.

## 2011-08-01 ENCOUNTER — Encounter: Payer: Self-pay | Admitting: Family Medicine

## 2011-08-01 NOTE — Assessment & Plan Note (Signed)
Thick white discharge concerning for yeast. Will check for STDs today.

## 2011-08-01 NOTE — Progress Notes (Signed)
  Subjective:    Patient ID: Natalie Brady, female    DOB: 11-06-1970, 41 y.o.   MRN: [id number]  HPI Patient complains of vaginal discharge. She says this is thick and white. She does have minimal itching. She seems to have some odor as well. She says that her periods have been more irregular now. The last year, she is having a regular period every other month and spotting for a week in between. She is a new partner and her not using condoms but they are monogamous. She has a BTL for contraception. He is not having abdominal pain, nausea, vomiting.  Chest pain-yesterday patient experienced one episode of chest pain. She said that this came on when she was caring her trash can up a very small hill to the end of the road. She described it as sharp and stabbing, centrally located. This made her hands get sweaty. It lasted about 7 minutes and resolved with rest. She did not have any nausea with it. She has never experienced a sensation like this before. She does walk for exercise without chest pain but she is limited in her speed because of her arthritis in her knees. Currently she is not having any chest pain, shortness of breath, dizziness, headache, chills, fever.  Family history significant for no early heart attacks. However, both her mother and father had diabetes.   Review of Systems See above    Objective:   Physical Exam  Vital signs reviewed General appearance - alert, well appearing, and in no distress Heart - normal rate, regular rhythm, normal S1, S2, no murmurs, rubs, clicks or gallops Chest - clear to auscultation, no wheezes, rales or rhonchi, symmetric air entry, no tachypnea, retractions or cyanosis Abdomen - soft, nontender, nondistended, no masses or organomegaly GYN- external genetalia normal, without lesions.  Vagina normal color, rugations, with some white discharge.  Cervix normal color without lesions or discharge. Extremities - peripheral pulses normal, no pedal edema,  no clubbing or cyanosis       Assessment & Plan:

## 2011-08-02 ENCOUNTER — Encounter: Payer: Self-pay | Admitting: Family Medicine

## 2011-09-05 LAB — LIPID PANEL
Cholesterol: 264 mg/dL — AB (ref 0–200)
HDL: 58 mg/dL (ref 35–70)

## 2011-09-05 LAB — BASIC METABOLIC PANEL: Creatinine: 0.7 mg/dL (ref 0.5–1.1)

## 2011-09-05 LAB — CBC AND DIFFERENTIAL: Hemoglobin: 11.9 g/dL — AB (ref 12.0–16.0)

## 2011-09-09 ENCOUNTER — Encounter: Payer: Self-pay | Admitting: Family Medicine

## 2012-01-21 ENCOUNTER — Ambulatory Visit (INDEPENDENT_AMBULATORY_CARE_PROVIDER_SITE_OTHER): Payer: BC Managed Care – PPO | Admitting: Family Medicine

## 2012-01-21 VITALS — BP 142/94 | HR 89 | Temp 98.2°F | Ht 63.0 in | Wt 171.0 lb

## 2012-01-21 DIAGNOSIS — Z23 Encounter for immunization: Secondary | ICD-10-CM

## 2012-01-21 DIAGNOSIS — R030 Elevated blood-pressure reading, without diagnosis of hypertension: Secondary | ICD-10-CM

## 2012-01-21 DIAGNOSIS — J069 Acute upper respiratory infection, unspecified: Secondary | ICD-10-CM | POA: Insufficient documentation

## 2012-01-21 NOTE — Assessment & Plan Note (Signed)
Mild URI, resolving.  Advised symptomatic care.  Discussed usual course, follow-up if no improvement

## 2012-01-21 NOTE — Patient Instructions (Addendum)
Borderline high blood pressure.  Normal is less than 140/90.  If you notice blood pressures remain high, please schedule follow-up with primary doctor  For nasal congestion, try nasal sale spray or sinus irrigation such as NeilMed Sinus Rinse  Ok to go back to work- wash hands frequently, wear mask if coughing

## 2012-01-21 NOTE — Assessment & Plan Note (Signed)
Mild elevation, advised follow-up if remains high (works as EMT) and will recheck at subsequent  Visits with PCP

## 2012-01-21 NOTE — Progress Notes (Signed)
  Subjective:    Patient ID: Natalie Brady, female    DOB: Jul 25, 1970, 41 y.o.   MRN: [id number]  HPI 5 days of URI  Started with sneezing, then progressed to coughing 2 days ago.  One episode of vomiting, now resolved.  No fever or body aches.  + sore throat and mild nasal congestion.  No dyspnea.  No sick contacts-  a baby neighbor that came to visit  was sick.  I have reviewed patient's  PMH, FH, and Social history and Medications as related to this visit. Sig for RA, takes enbrel, patient states she was instructed to discontinue this while having acute illness.  Patient works at [employer] Review of Systems See Hpi     Objective:   Physical Exam GEN: Alert & Oriented, No acute distress HEENT: [place]/AT. EOMI, PERRLA, no conjunctival injection or scleral icterus.  Bilateral tympanic membranes intact without erythema or effusion.  .  Nares without edema or rhinorrhea.  Oropharynx is without erythema or exudates.  No anterior or posterior cervical lymphadenopathy.  Hoarse voice. CV:  Regular Rate & Rhythm, no murmur Respiratory:  Normal work of breathing, CTAB         Assessment & Plan:

## 2012-02-18 ENCOUNTER — Emergency Department (HOSPITAL_COMMUNITY)
Admission: EM | Admit: 2012-02-18 | Discharge: 2012-02-19 | Disposition: A | Discharge status: Home / Self Care | Payer: BC Managed Care – PPO | Attending: Emergency Medicine | Admitting: Emergency Medicine

## 2012-02-18 ENCOUNTER — Encounter (HOSPITAL_COMMUNITY): Payer: Self-pay | Admitting: Emergency Medicine

## 2012-02-18 DIAGNOSIS — R112 Nausea with vomiting, unspecified: Secondary | ICD-10-CM | POA: Insufficient documentation

## 2012-02-18 DIAGNOSIS — R197 Diarrhea, unspecified: Secondary | ICD-10-CM | POA: Insufficient documentation

## 2012-02-18 DIAGNOSIS — K219 Gastro-esophageal reflux disease without esophagitis: Secondary | ICD-10-CM | POA: Insufficient documentation

## 2012-02-18 DIAGNOSIS — R52 Pain, unspecified: Secondary | ICD-10-CM | POA: Insufficient documentation

## 2012-02-18 DIAGNOSIS — R34 Anuria and oliguria: Secondary | ICD-10-CM | POA: Insufficient documentation

## 2012-02-18 DIAGNOSIS — Z79899 Other long term (current) drug therapy: Secondary | ICD-10-CM | POA: Insufficient documentation

## 2012-02-18 DIAGNOSIS — Z8739 Personal history of other diseases of the musculoskeletal system and connective tissue: Secondary | ICD-10-CM | POA: Insufficient documentation

## 2012-02-18 DIAGNOSIS — Z85820 Personal history of malignant melanoma of skin: Secondary | ICD-10-CM | POA: Insufficient documentation

## 2012-02-18 DIAGNOSIS — Z8719 Personal history of other diseases of the digestive system: Secondary | ICD-10-CM | POA: Insufficient documentation

## 2012-02-18 NOTE — ED Notes (Signed)
Pt alert arrives from home, c/o body aches, nausea, emesis, denies fever, denies cough, resp even unlabored, skin pwd, states "projectile vomiting while at work"

## 2012-02-18 NOTE — ED Notes (Signed)
Pt states she has 5/10 pressure in chest, skin pwd, EKG ordered

## 2012-02-19 LAB — CBC WITH DIFFERENTIAL/PLATELET
Eosinophils Absolute: 0.1 10*3/uL (ref 0.0–0.7)
Eosinophils Relative: 1 % (ref 0–5)
Hemoglobin: 12.9 g/dL (ref 12.0–15.0)
Lymphocytes Relative: 5 % — ABNORMAL LOW (ref 12–46)
MCHC: 33.6 g/dL (ref 30.0–36.0)
Monocytes Absolute: 0.4 10*3/uL (ref 0.1–1.0)
Monocytes Relative: 3 % (ref 3–12)
Neutro Abs: 14.5 10*3/uL — ABNORMAL HIGH (ref 1.7–7.7)
Neutrophils Relative %: 92 % — ABNORMAL HIGH (ref 43–77)
RDW: 13.6 % (ref 11.5–15.5)
WBC: 15.7 10*3/uL — ABNORMAL HIGH (ref 4.0–10.5)

## 2012-02-19 LAB — URINE MICROSCOPIC-ADD ON

## 2012-02-19 LAB — URINALYSIS, ROUTINE W REFLEX MICROSCOPIC
Hgb urine dipstick: NEGATIVE
Nitrite: NEGATIVE
Specific Gravity, Urine: 1.025 (ref 1.005–1.030)
Urobilinogen, UA: 0.2 mg/dL (ref 0.0–1.0)
pH: 5 (ref 5.0–8.0)

## 2012-02-19 LAB — POCT I-STAT, CHEM 8
BUN: 18 mg/dL (ref 6–23)
Calcium, Ion: 0.98 mmol/L — ABNORMAL LOW (ref 1.12–1.23)
Creatinine, Ser: 0.6 mg/dL (ref 0.50–1.10)
Glucose, Bld: 134 mg/dL — ABNORMAL HIGH (ref 70–99)
Hemoglobin: 13.6 g/dL (ref 12.0–15.0)
Sodium: 140 mEq/L (ref 135–145)
TCO2: 25 mmol/L (ref 0–100)

## 2012-02-19 MED ORDER — ONDANSETRON HCL 4 MG/2ML IJ SOLN
4.0000 mg | Freq: Once | INTRAMUSCULAR | Status: AC
  Administered 2012-02-19: 4 mg via INTRAVENOUS
  Filled 2012-02-19: qty 2

## 2012-02-19 MED ORDER — SODIUM CHLORIDE 0.9 % IV BOLUS (SEPSIS)
1000.0000 mL | Freq: Once | INTRAVENOUS | Status: AC
  Administered 2012-02-19: 1000 mL via INTRAVENOUS

## 2012-02-19 MED ORDER — ONDANSETRON HCL 4 MG PO TABS: 4.0000 mg | ORAL_TABLET | Freq: Three times a day (TID) | ORAL | Status: DC | PRN

## 2012-02-19 NOTE — ED Provider Notes (Signed)
Medical screening examination/treatment/procedure(s) were performed by non-physician practitioner and as supervising physician I was immediately available for consultation/collaboration.  Olivia Mackie, MD 02/19/12 [phone]

## 2012-02-19 NOTE — ED Provider Notes (Signed)
History     CSN: [id number]  Arrival date & time 02/18/12  2223   First MD Initiated Contact with Patient 02/19/12 0056      Chief Complaint  Patient presents with  . Generalized Body Aches  . Emesis  . 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  Denies fever cough sore throat   Patient is a 42 y.o. female presenting with vomiting and diarrhea. The history is provided by the patient.  Emesis  This is a new problem. The current episode started 3 to 5 hours ago. The problem has been gradually worsening. The emesis has an appearance of bilious material. There has been no fever. Associated symptoms include diarrhea. Pertinent negatives include no chills, no cough, no fever and no myalgias. Risk factors include ill contacts.  Diarrhea The primary symptoms include nausea, vomiting and diarrhea. Primary symptoms do not include fever, myalgias or rash.  The illness does not include chills.    Past Medical History  Diagnosis Date  . Melanoma     2005 surgery   . Arthritis   . Chronic headaches   . GERD (gastroesophageal reflux disease)   . Celiac disease 2008    Past Surgical History  Procedure Date  . Cesarean section     93 96  . Breast reduction surgery   . Liposuction     Family History  Problem Relation Age of Onset  . Diabetes Mother   . Diabetes Father   . Coronary artery disease Maternal Grandmother   . Coronary artery disease Paternal Grandmother   . Hypertension Mother   . Stomach cancer Paternal Grandfather     History  Substance Use Topics  . Smoking status: Never Smoker   . Smokeless tobacco: Never Used  . Alcohol Use: No    OB History    Grav Para Term Preterm Abortions TAB SAB Ect Mult Living                  Review of Systems  Constitutional: Negative for fever  and chills.  HENT: Negative.   Eyes: Negative.   Respiratory: Negative for cough.   Cardiovascular: Negative for chest pain.  Gastrointestinal: Positive for nausea, vomiting and diarrhea.  Genitourinary: Positive for decreased urine volume.  Musculoskeletal: Negative for myalgias.  Skin: Negative for rash.  Neurological: Negative for dizziness and weakness.    Allergies  Review of patient's allergies indicates no known allergies.  Home Medications   Current Outpatient Rx  Name  Route  Sig  Dispense  Refill  . ETANERCEPT 25 MG [place] KIT   Subcutaneous   Inject 25 mg into the skin once a week. Per rheumatology; take on Sundays.         . LEFLUNOMIDE 20 MG PO TABS   Oral   Take 20 mg by mouth daily.          Marland Kitchen LOPERAMIDE HCL 2 MG PO CAPS   Oral   Take 2 mg by mouth 4 (four) times daily as needed. For diarrhea.         Marland Kitchen NITROGLYCERIN 0.4 MG SL SUBL   Sublingual   Place 0.4 mg under the tongue every 5 (five) minutes x 3 doses as needed. For chest pain.         Marland Kitchen PRAVASTATIN SODIUM 40 MG PO TABS   Oral   Take 1  tablet (40 mg total) by mouth at bedtime.   30 tablet   3   . TEMAZEPAM 15 MG PO CAPS   Oral   Take 15 mg by mouth at bedtime as needed. For sleep.         Marland Kitchen ONDANSETRON HCL 4 MG PO TABS   Oral   Take 1 tablet (4 mg total) by mouth every 8 (eight) hours as needed for nausea.   12 tablet   0     BP 145/91  Pulse 98  Temp 98 F (36.7 C)  Resp 16  SpO2 99%  LMP 12/17/2011  Physical Exam  Constitutional: She appears well-developed and well-nourished.  HENT:  Head: Normocephalic.  Eyes: Pupils are equal, round, and reactive to light.  Neck: Normal range of motion.  Cardiovascular: Normal rate.   Pulmonary/Chest: Effort normal.  Abdominal: Soft.  Musculoskeletal: Normal range of motion.  Neurological: She is alert.  Skin: Skin is warm. Rash noted.    ED Course  Procedures (including critical care time)  Labs Reviewed  URINALYSIS,  ROUTINE W REFLEX MICROSCOPIC - Abnormal; Notable for the following:    APPearance TURBID (*)     Ketones, ur TRACE (*)     Protein, ur 30 (*)     All other components within normal limits  CBC WITH DIFFERENTIAL - Abnormal; Notable for the following:    WBC 15.7 (*)     Neutrophils Relative 92 (*)     Neutro Abs 14.5 (*)     Lymphocytes Relative 5 (*)     All other components within normal limits  POCT I-STAT, CHEM 8 - Abnormal; Notable for the following:    Glucose, Bld 134 (*)     Calcium, Ion 0.98 (*)     All other components within normal limits  URINE MICROSCOPIC-ADD ON   No results found.   1. Nausea & vomiting       MDM   Tolerating PO        Arman Filter, NP 02/19/12 [phone]

## 2013-01-12 ENCOUNTER — Encounter: Payer: Self-pay | Admitting: Family Medicine

## 2013-01-12 ENCOUNTER — Ambulatory Visit (INDEPENDENT_AMBULATORY_CARE_PROVIDER_SITE_OTHER): Payer: BC Managed Care – PPO | Admitting: Family Medicine

## 2013-01-12 VITALS — BP 150/98 | HR 94 | Temp 98.0°F | Ht 63.0 in | Wt 179.0 lb

## 2013-01-12 DIAGNOSIS — A499 Bacterial infection, unspecified: Secondary | ICD-10-CM

## 2013-01-12 DIAGNOSIS — J329 Chronic sinusitis, unspecified: Secondary | ICD-10-CM

## 2013-01-12 DIAGNOSIS — M069 Rheumatoid arthritis, unspecified: Secondary | ICD-10-CM

## 2013-01-12 DIAGNOSIS — B9689 Other specified bacterial agents as the cause of diseases classified elsewhere: Secondary | ICD-10-CM

## 2013-01-12 MED ORDER — AZITHROMYCIN 250 MG PO TABS: ORAL_TABLET | ORAL | Status: DC

## 2013-01-12 NOTE — Patient Instructions (Signed)
Come back and see [place] if not doing better over the next week.    Sinusitis Sinusitis is redness, soreness, and swelling (inflammation) of the paranasal sinuses. Paranasal sinuses are air pockets within the bones of your face (beneath the eyes, the middle of the forehead, or above the eyes). In healthy paranasal sinuses, mucus is able to drain out, and air is able to circulate through them by way of your nose. However, when your paranasal sinuses are inflamed, mucus and air can become trapped. This can allow bacteria and other germs to grow and cause infection. Sinusitis can develop quickly and last only a short time (acute) or continue over a long period (chronic). Sinusitis that lasts for more than 12 weeks is considered chronic.  CAUSES  Causes of sinusitis include:  Allergies.  Structural abnormalities, such as displacement of the cartilage that separates your nostrils (deviated septum), which can decrease the air flow through your nose and sinuses and affect sinus drainage.  Functional abnormalities, such as when the small hairs (cilia) that line your sinuses and help remove mucus do not work properly or are not present. SYMPTOMS  Symptoms of acute and chronic sinusitis are the same. The primary symptoms are pain and pressure around the affected sinuses. Other symptoms include:  Upper toothache.  Earache.  Headache.  Bad breath.  Decreased sense of smell and taste.  A cough, which worsens when you are lying flat.  Fatigue.  Fever.  Thick drainage from your nose, which often is green and may contain pus (purulent).  Swelling and warmth over the affected sinuses. DIAGNOSIS  Your caregiver will perform a physical exam. During the exam, your caregiver may:  Look in your nose for signs of abnormal growths in your nostrils (nasal polyps).  Tap over the affected sinus to check for signs of infection.  View the inside of your sinuses (endoscopy) with a special imaging device  with a light attached (endoscope), which is inserted into your sinuses. If your caregiver suspects that you have chronic sinusitis, one or more of the following tests may be recommended:  Allergy tests.  Nasal culture A sample of mucus is taken from your nose and sent to a lab and screened for bacteria.  Nasal cytology A sample of mucus is taken from your nose and examined by your caregiver to determine if your sinusitis is related to an allergy. TREATMENT  Most cases of acute sinusitis are related to a viral infection and will resolve on their own within 10 days. Sometimes medicines are prescribed to help relieve symptoms (pain medicine, decongestants, nasal steroid sprays, or saline sprays).  However, for sinusitis related to a bacterial infection, your caregiver will prescribe antibiotic medicines. These are medicines that will help kill the bacteria causing the infection.  Rarely, sinusitis is caused by a fungal infection. In theses cases, your caregiver will prescribe antifungal medicine. For some cases of chronic sinusitis, surgery is needed. Generally, these are cases in which sinusitis recurs more than 3 times per year, despite other treatments. HOME CARE INSTRUCTIONS   Drink plenty of water. Water helps thin the mucus so your sinuses can drain more easily.  Use a humidifier.  Inhale steam 3 to 4 times a day (for example, sit in the bathroom with the shower running).  Apply a warm, moist washcloth to your face 3 to 4 times a day, or as directed by your caregiver.  Use saline nasal sprays to help moisten and clean your sinuses.  Take over-the-counter or  prescription medicines for pain, discomfort, or fever only as directed by your caregiver. SEEK IMMEDIATE MEDICAL CARE IF:  You have increasing pain or severe headaches.  You have nausea, vomiting, or drowsiness.  You have swelling around your face.  You have vision problems.  You have a stiff neck.  You have difficulty  breathing. MAKE SURE YOU:   Understand these instructions.  Will watch your condition.  Will get help right away if you are not doing well or get worse. Document Released: 01/21/2005 Document Revised: 04/15/2011 Document Reviewed: 02/05/2011 [facility] Patient Information 2014 [place], [place].

## 2013-01-12 NOTE — Assessment & Plan Note (Signed)
Holding Humira while acutely ill.

## 2013-01-12 NOTE — Progress Notes (Addendum)
  Tana Conch, MD Phone: [phone]  Subjective:     Natalie Brady is a 42 y.o. female who presents for evaluation of sinus pain. Symptoms include: congestion, cough, facial pain, fevers, frequent clearing of the throat, headaches and purulent rhinorrhea. Onset of symptoms was 2 days ago. Symptoms have been gradually worsening since that time. Past history is significant for rheumatoid arthritis for which she is currently holding Humira as instructed. Patient is a non-smoker. Fevers as high as 101.1 on ibuprofen. This is 3rd day of symptoms. Hs tried mucinex, sudafed, and allergy medicine with little relief. 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 Was also febrile first day of illness.   Past medical history-allergic rhinitis, intermittently elevated blood pressure  Review of Systems Endorses fatigue and malaise. No vomiting. No diarrhea. No diffuse muscle aches. Pain above teeth with eating.   Objective:    BP 150/98  Pulse 94  Temp(Src) 98 F (36.7 C) (Oral)  Ht 5\' 3"  (1.6 m)  Wt 179 lb (81.194 kg)  BMI 31.72 kg/m2 General appearance: alert, cooperative and appears very fatigued Head: Normocephalic, without obvious abnormality, atraumatic Eyes: negative Ears: normal TM's and external ear canals both ears Nose: purulent discharge, turbinates red, swollen, sinus tenderness bilateral over frontal and maxillary.  Throat: lips, mucosa, and tongue normal; teeth and gums normal Lungs: clear to auscultation bilaterally Heart: regular rate and rhythm, S1, S2 normal, no murmur, click, rub or gallop Extremities: extremities normal, atraumatic, no cyanosis or edema Pulses: 2+ and symmetric Skin: Skin color, texture, turgor normal. No rashes or lesions. Mildly dry mucus membranes Lymph nodes: Cervical adenopathy: noted    Assessment:    Acute bacterial sinusitis.   3 days severe symptoms worsening (febrile, facial pain, pain  with chewing, purulent nasal discharge)  Plan:    Nasal saline sprays. Neti pot recommended. Instructions given. Zithromax per medication orders.  F/u prn if not improving.   Rheumatoid arthritis Holding Humira while acutely ill.   Meds ordered this encounter  Medications  . azithromycin (ZITHROMAX) 250 MG tablet    Sig: Take 2 tabs day 1, then 1 tab each day for 5 days.    Dispense:  6 tablet    Refill:  0

## 2013-02-24 ENCOUNTER — Encounter: Payer: Self-pay | Admitting: Family Medicine

## 2013-02-24 ENCOUNTER — Ambulatory Visit (INDEPENDENT_AMBULATORY_CARE_PROVIDER_SITE_OTHER): Payer: BC Managed Care – PPO | Admitting: Family Medicine

## 2013-02-24 VITALS — BP 150/98 | HR 88 | Temp 98.4°F | Wt 181.0 lb

## 2013-02-24 DIAGNOSIS — R531 Weakness: Secondary | ICD-10-CM

## 2013-02-24 DIAGNOSIS — M6281 Muscle weakness (generalized): Secondary | ICD-10-CM

## 2013-02-24 NOTE — Patient Instructions (Addendum)
I am worried about delayed presentation of stroke. The fact you dropped something in your left arm (4-/5 compared to 4+ on right arm) concerns me.  We need to get a CT scan of your head to rule out stroke for further evaluation. There is a possibility you may need to be admitted. Also, discuss with EDP if they need to move the scan down far enough to look behind your jaw due to the pain in that area.   See [place] in follow up, Dr. Yong Channel

## 2013-02-25 DIAGNOSIS — R531 Weakness: Secondary | ICD-10-CM | POA: Insufficient documentation

## 2013-02-25 NOTE — Assessment & Plan Note (Addendum)
Told patient I was worried about this being delayed presentation of stroke. I instructed patient to go to the ED for CT of her head and possible admission with echo, MRI, carotid dopplers, telemetry. Patient was in agreement at time office visit ended. Her headache which was almost "thunderclap like" also worried me for sub arachnoid hemorrhage but given improvement and current stability I doubt this diagnosis. Regarding continued neck pain, carotid artery dissection would be a possibility which could lead to CVA.   I called patient around 9 am as was not in the emergency room. Patient stated she didn't have the time to go in as she had to work at 10am the next day. She also told me she would not be able to go for a scan on Thursday or Friday. i warned patient this could be a small stroke and could later have a larger stroke (although has few risk factors). Patient stated this did not change her decision. I asked patient to take a baby aspirin 81mg  daily. i discussed case with Dr. Lindell Noe both at time of visit and after additional information with the plan being that patient should return to follow up if she wants to continue investigation.

## 2013-02-25 NOTE — Progress Notes (Signed)
Natalie Reddish, MD Phone: [phone]  Subjective:  Chief complaint-noted  Headache/left arm or hand weakness On Sunday, patient described a feeling of a lightning bolt going from behind her left thigh up to her head. Suddenly, diffuse headache came on that was 10/10. Pain was paralyzing per patient. Lasted 5-7 minutes then pain came to be focused behind left jaw at 10/10 level. Has had headaches in the past but nothing like this. This was worst headache of her life beyond the migraines she had as a teenager. Denies history of grinding teeth or TMJD. 3/10 headache aching persisted. Patient states she had 1 more episode of the lightning feeling and diffuse headache which also lasted 5-7 minutes. Patient works as Public relations account executive and states she has had trouble with her left arm where it feels like she cannot hold objects or drops things at times. Pain behind her left jaw  Is described also as an aching pain that has been constant. She does not feel any obvious abnormalities in that area.  ROS-no reported weakness in left lower extremity or any on right side. No slurring of words. No facial droop. No trouble swallowing. No paresthesias on face or limbs. 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   Past Medical History-rheumatoid arthritis, allergic rhinitis. No diagnosis of hypertension, hyperlipidemia or diabetes.    Medications- reviewed and updated Current Outpatient Prescriptions  Medication Sig Dispense Refill  . Adalimumab (HUMIRA PEN [place]) Inject into the skin.      Marland Kitchen azithromycin (ZITHROMAX) 250 MG tablet Take 2 tabs day 1, then 1 tab each day for 5 days.  6 tablet  0  . leflunomide (ARAVA) 20 MG tablet Take 20 mg by mouth daily.       . temazepam (RESTORIL) 15 MG capsule Take 15 mg by mouth at bedtime as needed. For sleep.       Current Facility-Administered Medications  Medication Dose Route  Frequency Provider Last Rate Last Dose  . Hepatitis B Vac Recombinant INJ 20 mcg  20 mcg Intramuscular Once Lind Covert, MD      . varicella virus vaccine (live) injection 0.5 mL  0.5 mL Subcutaneous Once Lind Covert, MD       Objective: BP 150/98  Pulse 88  Temp(Src) 98.4 F (36.9 C) (Oral)  Wt 181 lb (82.101 kg) Gen: NAD, resting comfortably on table CV: RRR no murmurs rubs or gallops Lungs: CTAB no crackles, wheeze, rhonchi Ext: no edema Neuro: CN II-XII intact, sensation and reflexes normal throughout, 5/5 muscle strength in bilateral upper and lower extremities with the exception of 4-/5 strength in left bicep and 5-/5 strength in right bicep. Normal finger to nose. Normal rapid alternating movements. Normal gait.   Assessment/Plan:  Left-sided weakness Told patient I was worried about this being delayed presentation of stroke. I instructed patient to go to the ED for CT of her head and possible admission with echo, MRI, carotid dopplers, telemetry. Patient was in agreement at time office visit ended.   I called patient around 9 am as was not in the emergency room. Patient stated she didn't have the time to go in as she had to work at 10am the next day. She also told me she would not be able to go for a scan on Thursday or Friday. i warned patient this could be a small stroke and could later  have a larger stroke (although has few risk factors). Patient stated this did not change her decision. I asked patient to take a baby aspirin 81mg  daily. i discussed case with Dr. Lindell Noe both at time of visit and after additional information with the plan being that patient should return to follow up if she wants to continue investigation.    >50% of 25 minute office visit was spent on counseling (need to go to ED for further evaluation and potentially serious cause for her ailment) and coordination of care

## 2013-02-26 ENCOUNTER — Telehealth: Payer: Self-pay | Admitting: *Deleted

## 2013-02-26 NOTE — Telephone Encounter (Signed)
Message copied by Bobbye Riggs on Fri Feb 26, 2013  9:07 AM ------      Message from: Marin Olp      Created: Thu Feb 25, 2013  6:16 PM       Would you mind calling patient? I discussed with attending physician and patient will need to present for follow up as soon as she is able (Monday is when patient told me). Symptoms can be followed up at that time as well as decision on imaging.  Our recommendation would still be for her to go to the emergency room though for further evaluation.  ------

## 2013-02-26 NOTE — Telephone Encounter (Signed)
LMOVM for pt to return call.  Clarified below message with Dr. Yong Channel, he would like for pt to come in on Monday to be seen.  Can be with any provider. Tray Klayman, Salome Spotted

## 2013-10-20 ENCOUNTER — Encounter: Payer: Self-pay | Admitting: Family Medicine

## 2013-10-20 ENCOUNTER — Other Ambulatory Visit: Payer: Self-pay | Admitting: Family Medicine

## 2013-10-20 ENCOUNTER — Ambulatory Visit (INDEPENDENT_AMBULATORY_CARE_PROVIDER_SITE_OTHER): Payer: BLUE CROSS/BLUE SHIELD | Admitting: Family Medicine

## 2013-10-20 VITALS — BP 138/98 | HR 92 | Temp 98.6°F | Wt 180.0 lb

## 2013-10-20 DIAGNOSIS — G4700 Insomnia, unspecified: Secondary | ICD-10-CM | POA: Diagnosis not present

## 2013-10-20 DIAGNOSIS — N63 Unspecified lump in unspecified breast: Secondary | ICD-10-CM

## 2013-10-20 MED ORDER — TEMAZEPAM 15 MG PO CAPS: 15.0000 mg | ORAL_CAPSULE | Freq: Every evening | ORAL | Status: DC | PRN

## 2013-10-20 NOTE — Progress Notes (Signed)
    Subjective:    Patient ID: Natalie Brady is a 43 y.o. female presenting with Breast Mass, Medication Refill and Back Pain  on 10/20/2013  HPI: Lump on left breast above the nipple.  It is sore and firm. Denies family history of breast cancer. No mammogram done. Reports regular self breast checks. New lump found last night.  Was not there last month.  No skin changes.   Review of Systems  Constitutional: Negative for fever and chills.  Respiratory: Negative for shortness of breath.   Cardiovascular: Negative for chest pain.  Gastrointestinal: Negative for abdominal pain.  Skin: Negative for rash.      Objective:    BP 138/98  Pulse 92  Temp(Src) 98.6 F (37 C) (Oral)  Wt 180 lb (81.647 kg)  LMP 10/06/2013 Physical Exam  Constitutional: She is oriented to person, place, and time. She appears well-developed and well-nourished. No distress.  HENT:  Head: Normocephalic and atraumatic.  Eyes: No scleral icterus.  Neck: Neck supple.  Cardiovascular: Normal rate.   Pulmonary/Chest: Effort normal. Left breast exhibits mass.    Abdominal: Soft.  Neurological: She is alert and oriented to person, place, and time.  Skin: Skin is warm and dry.  Psychiatric: She has a normal mood and affect.        Assessment & Plan:  Feels like fibrocystic change but will check mammogram. Problem List Items Addressed This Visit   None    Visit Diagnoses   Insomnia    -  Primary    Relevant Medications       temazepam (RESTORIL) capsule    Breast lump        Relevant Orders       MM Digital Diagnostic Bilat       Return for a follow-up.

## 2013-10-20 NOTE — Patient Instructions (Signed)
Fibrocystic Breast Changes Fibrocystic breast changes occur when breast ducts become blocked, causing painful, fluid-filled lumps (cysts) to form in the breast. This is a common condition that is noncancerous (benign). It occurs when women go through hormonal changes during their menstrual cycle. Fibrocystic breast changes can affect one or both breasts. CAUSES  The exact cause of fibrocystic breast changes is not known, but it may be related to the female hormones estrogen and progesterone. Family traits that get passed from parent to child (genetics) may also be a factor in some cases. SIGNS AND SYMPTOMS   Tenderness, mild discomfort, or pain.   Swelling.   Ropelike feeling when touching the breast.   Lumpy breast, one or both sides.   Changes in breast size, especially before (larger) and after (smaller) the menstrual period.   Green or dark brown nipple discharge (not blood).  Symptoms are usually worse before menstrual periods start and get better toward the end of the menstrual period.  DIAGNOSIS  To make a diagnosis, your health care provider will ask you questions and perform a physical exam of your breasts. The health care provider may recommend other tests that can examine inside your breasts, such as:  A breast X-ray (mammogram).   Ultrasonography.  An MRI.  If something more than fibrocystic breast changes is suspected, your health care provider may take a breast tissue sample (breast biopsy) to examine. TREATMENT  Often, treatment is not needed. Your health care provider may recommend over-the-counter pain relievers to help lessen pain or discomfort caused by the fibrocystic breast changes. You may also be asked to change your diet to limit or stop eating foods or drinking beverages that contain caffeine. Foods and beverages that contain caffeine include chocolate, soda, coffee, and tea. Reducing sugar and fat in your diet may also help. Your health care provider may  also recommend:  Fine needle aspiration to remove fluid from a cyst that is causing pain.   Surgery to remove a large, persistent, and tender cyst. HOME CARE INSTRUCTIONS   Examine your breasts after every menstrual period. If you do not have menstrual periods, check your breasts the first day of every month. Feel for changes, such as more tenderness, a new growth, a change in breast size, or a change in a lump that has always been there.   Only take over-the-counter or prescription medicine as directed by your health care provider.   Wear a well-fitted support or sports bra, especially when exercising.   Decrease or avoid caffeine, fat, and sugar in your diet as directed by your health care provider.  SEEK MEDICAL CARE IF:   You have fluid leaking (discharge) from your nipples, especially bloody discharge.   You have new lumps or bumps in the breast.   Your breast or breasts become enlarged, red, and painful.   You have areas of your breast that pucker in.   Your nipples appear flat or indented.  Document Released: 11/07/2005 Document Revised: 01/26/2013 Document Reviewed: 07/12/2012 [employer] Patient Information 2015 [employer]. This information is not intended to replace advice given to you by your health care provider. Make sure you discuss any questions you have with your health care provider.  

## 2013-10-25 ENCOUNTER — Other Ambulatory Visit: Payer: Self-pay

## 2013-10-25 ENCOUNTER — Other Ambulatory Visit: Payer: Self-pay | Admitting: Family Medicine

## 2013-10-25 DIAGNOSIS — N63 Unspecified lump in unspecified breast: Secondary | ICD-10-CM

## 2013-10-28 ENCOUNTER — Ambulatory Visit
Admission: RE | Admit: 2013-10-28 | Discharge: 2013-10-28 | Disposition: A | Discharge status: Home / Self Care | Payer: BC Managed Care – PPO | Source: Ambulatory Visit | Attending: Family Medicine | Admitting: Family Medicine

## 2013-10-28 DIAGNOSIS — N63 Unspecified lump in unspecified breast: Secondary | ICD-10-CM

## 2014-01-03 ENCOUNTER — Other Ambulatory Visit: Payer: Self-pay | Admitting: Orthopedic Surgery

## 2014-01-03 DIAGNOSIS — M545 Low back pain: Secondary | ICD-10-CM

## 2014-01-15 ENCOUNTER — Other Ambulatory Visit: Payer: BC Managed Care – PPO

## 2014-09-01 ENCOUNTER — Ambulatory Visit (INDEPENDENT_AMBULATORY_CARE_PROVIDER_SITE_OTHER): Payer: BLUE CROSS/BLUE SHIELD | Admitting: Family Medicine

## 2014-09-01 ENCOUNTER — Encounter: Payer: Self-pay | Admitting: Family Medicine

## 2014-09-01 VITALS — BP 168/102 | HR 75 | Temp 97.8°F | Ht 63.75 in | Wt 182.0 lb

## 2014-09-01 DIAGNOSIS — G4700 Insomnia, unspecified: Secondary | ICD-10-CM

## 2014-09-01 DIAGNOSIS — H6092 Unspecified otitis externa, left ear: Secondary | ICD-10-CM | POA: Diagnosis not present

## 2014-09-01 DIAGNOSIS — H9202 Otalgia, left ear: Secondary | ICD-10-CM | POA: Diagnosis not present

## 2014-09-01 DIAGNOSIS — R030 Elevated blood-pressure reading, without diagnosis of hypertension: Secondary | ICD-10-CM | POA: Diagnosis not present

## 2014-09-01 DIAGNOSIS — H9209 Otalgia, unspecified ear: Secondary | ICD-10-CM | POA: Insufficient documentation

## 2014-09-01 MED ORDER — TEMAZEPAM 15 MG PO CAPS: 15.0000 mg | ORAL_CAPSULE | Freq: Every evening | ORAL | Status: DC | PRN

## 2014-09-01 MED ORDER — CIPROFLOXACIN-HYDROCORTISONE 0.2-1 % OT SUSP: 3.0000 [drp] | Freq: Two times a day (BID) | OTIC | Status: DC

## 2014-09-01 NOTE — Assessment & Plan Note (Addendum)
Most likely secondary to otitis externa. Pain with palpation of the tragus and ear canal is erythematous She reports that she has been swimming lately Membrane is not bulging and it is also translucent - Cipro drops, 3 drops twice a day for 7 days - Advised Tylenol for pain - If no improvement after 7 days she will call back

## 2014-09-01 NOTE — Assessment & Plan Note (Signed)
Restoril was refilled today at her request She uses when necessary for insomnia Last rx was done 10/20/13 - Given 30 tablets, advised she needs to follow-up or with her PCP for ongoing refills

## 2014-09-01 NOTE — Progress Notes (Signed)
   Subjective:    Patient ID: Natalie Brady, female    DOB: 1970/07/10, 44 y.o.   MRN: [id number]  Seen for Same day visit for   CC: ear pain   EAR PAIN  Location: left pain  Ear pain started: three day ago  Pain is: stabbing  Severity: 6/10, intermittent today but constant yesterday  Medications tried: advil  Recent ear trauma: no  Prior ear surgeries: no Antibiotics in the last 30 days: no History of diabetes: no  Symptoms Ear discharge: no Fever: no Pain with chewing:  Yes  Ringing in ears: some but not now  Dizziness: lightheaded when standing  Hearing loss: no Rashes or blisters around ear: no Weight loss: no  PMH - Smoking status noted.    HTN Disease Monitoring: Home BP Monitoring 130-140 SBP/88-90. May be this high due to pain. 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  Lightheadedness-  As above    Edema- no   Patient is requesting a refill of her temazepam that she uses for insomnia PRN.   Review of Systems   See HPI for ROS. Objective:  BP 171/106 mmHg  Pulse 75  Temp(Src) 97.8 F (36.6 C) (Oral)  Ht 5' 3.75" (1.619 m)  Wt 182 lb (82.555 kg)  BMI 31.50 kg/m2  LMP   General: NAD HEENT: Clear conjunctiva, oropharynx clear, uvula midline, normal nasolabial folds, erythema along left ear canal and tenderness upon palpation of the tragus, left tympanic membrane not retracted, bulging or erythematous. right ear canal and tympanic membrane normal Cardiac: RRR, normal heart sounds, no murmurs.  Respiratory: CTAB, normal effort Extremities: no edema or cyanosis. WWP. Skin: warm and dry, no rashes noted Neuro: alert and oriented, no focal deficits  BP recheck manual: 168/102    Assessment & Plan:  See Problem List Documentation

## 2014-09-01 NOTE — Patient Instructions (Signed)
Thank you for coming in,   Please use tylenol for pain.   Please call back in 7 days if your pain has not improved.   Please follow up with Dr. Erin Hearing for your yearly physical.   Please bring all of your medications with you to each visit.    Please feel free to call with any questions or concerns at any time, at [phone]. --Dr. Raeford Razor  Otitis Externa Otitis externa is a bacterial or fungal infection of the outer ear canal. This is the area from the eardrum to the outside of the ear. Otitis externa is sometimes called "swimmer's ear." CAUSES  Possible causes of infection include:  Swimming in dirty water.  Moisture remaining in the ear after swimming or bathing.  Mild injury (trauma) to the ear.  Objects stuck in the ear (foreign body).  Cuts or scrapes (abrasions) on the outside of the ear. SIGNS AND SYMPTOMS  The first symptom of infection is often itching in the ear canal. Later signs and symptoms may include swelling and redness of the ear canal, ear pain, and yellowish-white fluid (pus) coming from the ear. The ear pain may be worse when pulling on the earlobe. DIAGNOSIS  Your health care provider will perform a physical exam. A sample of fluid may be taken from the ear and examined for bacteria or fungi. TREATMENT  Antibiotic ear drops are often given for 10 to 14 days. Treatment may also include pain medicine or corticosteroids to reduce itching and swelling. HOME CARE INSTRUCTIONS   Apply antibiotic ear drops to the ear canal as prescribed by your health care provider.  Take medicines only as directed by your health care provider.  If you have diabetes, follow any additional treatment instructions from your health care provider.  Keep all follow-up visits as directed by your health care provider. PREVENTION   Keep your ear dry. Use the corner of a towel to absorb water out of the ear canal after swimming or bathing.  Avoid scratching or putting objects inside  your ear. This can damage the ear canal or remove the protective wax that lines the canal. This makes it easier for bacteria and fungi to grow.  Avoid swimming in lakes, polluted water, or poorly chlorinated pools.  You may use ear drops made of rubbing alcohol and vinegar after swimming. Combine equal parts of white vinegar and alcohol in a bottle. Put 3 or 4 drops into each ear after swimming. SEEK MEDICAL CARE IF:   You have a fever.  Your ear is still red, swollen, painful, or draining pus after 3 days.  Your redness, swelling, or pain gets worse.  You have a severe headache.  You have redness, swelling, pain, or tenderness in the area behind your ear. MAKE SURE YOU:   Understand these instructions.  Will watch your condition.  Will get help right away if you are not doing well or get worse. Document Released: 01/21/2005 Document Revised: 06/07/2013 Document Reviewed: 02/07/2011 [facility] Patient Information 2015 [place], [place]. This information is not intended to replace advice given to you by your health care provider. Make sure you discuss any questions you have with your health care provider.

## 2014-09-01 NOTE — Assessment & Plan Note (Signed)
Uncontrolled and asymtomatic today but never a hx of being this elevated. May be elevated secondary to pain. 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 - Advised to check her blood pressure and call in with the measurements - Based on pending measurements will determine if antihypertensives therapy is warranted. - Advised to follow up with her primary doctor for her yearly an

## 2014-12-19 ENCOUNTER — Other Ambulatory Visit: Payer: Self-pay

## 2014-12-19 DIAGNOSIS — Z1231 Encounter for screening mammogram for malignant neoplasm of breast: Secondary | ICD-10-CM

## 2015-01-16 ENCOUNTER — Ambulatory Visit
Admission: RE | Admit: 2015-01-16 | Discharge: 2015-01-16 | Disposition: A | Discharge status: Home / Self Care | Payer: BLUE CROSS/BLUE SHIELD | Source: Ambulatory Visit

## 2015-01-16 DIAGNOSIS — Z1231 Encounter for screening mammogram for malignant neoplasm of breast: Secondary | ICD-10-CM

## 2015-01-20 ENCOUNTER — Encounter: Payer: BLUE CROSS/BLUE SHIELD | Admitting: Family Medicine

## 2015-01-24 ENCOUNTER — Encounter: Payer: Self-pay | Admitting: Obstetrics & Gynecology

## 2015-01-24 ENCOUNTER — Ambulatory Visit (INDEPENDENT_AMBULATORY_CARE_PROVIDER_SITE_OTHER): Payer: BLUE CROSS/BLUE SHIELD | Admitting: Obstetrics & Gynecology

## 2015-01-24 VITALS — BP 135/89 | HR 89 | Resp 18 | Ht 64.0 in | Wt 187.0 lb

## 2015-01-24 DIAGNOSIS — Z1151 Encounter for screening for human papillomavirus (HPV): Secondary | ICD-10-CM | POA: Diagnosis not present

## 2015-01-24 DIAGNOSIS — Z0000 Encounter for general adult medical examination without abnormal findings: Secondary | ICD-10-CM

## 2015-01-24 DIAGNOSIS — Z124 Encounter for screening for malignant neoplasm of cervix: Secondary | ICD-10-CM

## 2015-01-24 DIAGNOSIS — Z01419 Encounter for gynecological examination (general) (routine) without abnormal findings: Secondary | ICD-10-CM

## 2015-01-24 NOTE — Progress Notes (Signed)
Subjective:    Natalie Brady is a 44 y.o. engaged W P2 (62 and 50 yo kids, no grands)  female who presents for an annual exam. The patient has no complaints today. The patient is sexually active. GYN screening history: last pap: was normal. The patient wears seatbelts: yes. The patient participates in regular exercise: no. Has the patient ever been transfused or tattooed?: yes. (transfusion postpartum) The patient reports that there is not domestic violence in her life.   Menstrual History: OB History    Gravida Para Term Preterm AB TAB SAB Ectopic Multiple Living   2 2 2       2       Menarche age: 51  Patient's last menstrual period was 01/09/2015.    The following portions of the patient's history were reviewed and updated as appropriate: allergies, current medications, past family history, past medical history, past social history, past surgical history and problem list.  Review of Systems Pertinent items are noted in HPI. 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 Denies dyspareunia. She has had a BTL. [facility]hing checks her fasting lipids/blood work. She has perimenopausal symptoms and is dealing with them. She skipped most periods last year.   Objective:    BP 135/89 mmHg  Pulse 89  Resp 18  Ht 5\' 4"  (1.626 m)  Wt 187 lb (84.823 kg)  BMI 32.08 kg/m2  LMP 01/09/2015  General Appearance:    Alert, cooperative, no distress, appears stated age  Head:    Normocephalic, without obvious abnormality, atraumatic  Eyes:    PERRL, conjunctiva/corneas clear, EOM's intact, fundi    benign, both eyes  Ears:    Normal TM's and external ear canals, both ears  Nose:   Nares normal, septum midline, mucosa normal, no drainage    or sinus tenderness  Throat:   Lips, mucosa, and tongue normal; teeth and gums normal  Neck:   Supple, symmetrical, trachea midline, no adenopathy;    thyroid:  no enlargement/tenderness/nodules; no carotid   bruit or JVD  Back:     Symmetric, no  curvature, ROM normal, no CVA tenderness  Lungs:     Clear to auscultation bilaterally, respirations unlabored  Chest Wall:    No tenderness or deformity   Heart:    Regular rate and rhythm, S1 and S2 normal, no murmur, rub   or gallop  Breast Exam:    No tenderness, masses, or nipple abnormality  Abdomen:     Soft, non-tender, bowel sounds active all four quadrants,    no masses, no organomegaly, obese  Genitalia:    Normal female without lesion, discharge or tenderness, NSSA, NT, mobile, no adnexal masses     Extremities:   Extremities normal, atraumatic, no cyanosis or edema  Pulses:   2+ and symmetric all extremities  Skin:   Skin color, texture, turgor normal, no rashes or lesions  Lymph nodes:   Cervical, supraclavicular, and axillary nodes normal  Neurologic:   CNII-XII intact, normal strength, sensation and reflexes    throughout  .    Assessment:    Healthy female exam.    Plan:     Breast self exam technique reviewed and patient encouraged to perform self-exam monthly. Thin prep Pap smear. with cotesting

## 2015-01-26 LAB — CYTOLOGY - PAP

## 2015-11-23 ENCOUNTER — Ambulatory Visit (INDEPENDENT_AMBULATORY_CARE_PROVIDER_SITE_OTHER): Payer: BLUE CROSS/BLUE SHIELD | Admitting: Family Medicine

## 2015-11-23 ENCOUNTER — Encounter: Payer: Self-pay | Admitting: Family Medicine

## 2015-11-23 DIAGNOSIS — J0100 Acute maxillary sinusitis, unspecified: Secondary | ICD-10-CM

## 2015-11-23 MED ORDER — AMOXICILLIN-POT CLAVULANATE 875-125 MG PO TABS: 1.0000 | ORAL_TABLET | Freq: Two times a day (BID) | ORAL | 0 refills | Status: DC

## 2015-11-23 MED ORDER — FLUCONAZOLE 150 MG PO TABS: 150.0000 mg | ORAL_TABLET | Freq: Once | ORAL | 0 refills | Status: AC

## 2015-11-23 NOTE — Progress Notes (Signed)
SORE THROAT Having a lot of facial pain and some epistaxis. Started 3 weeks ago.   Sore throat began 3 weeks ago. Pain is: resolved 2 days ago; now having a lot of facial pain and mucus production Severity: moderate; causing a HA Medications tried: OTCs, humidifier.  Strep throat exposure: no STD exposure: no  Symptoms Fever: yes, 100.3 Cough: productive; thick Runny nose: yes Muscle aches: yes Swollen Glands: no Trouble breathing: no Drooling: no Weight loss: no  Review of Symptoms - see HPI PMH - Smoking status noted.    CC, SH/smoking status, and VS noted  Objective: BP 138/88   Pulse 100   Temp 98.5 F (36.9 C) (Oral)   Ht 5\' 4"  (1.626 m)   Wt 188 lb (85.3 kg)   LMP 01/09/2015 Comment: light  SpO2 98%   BMI 32.27 kg/m  Gen: NAD, alert, cooperative. HEENT: NCAT, EOMI, PERRLA, OP erythematous without evidence of exudate, TMs clear bilaterally, nasal congestion present, TTP to bilateral sinuses (R>L), transillumination diminished on right side compared to left. Right-sided LAD, neck full ROM, MMM CV: Well-perfused. Resp: Non-labored. CTAB, no crackles or wheezes Neuro: Sensation intact throughout.   Assessment and plan:  Sinusitis, acute maxillary Patient is here with signs and symptoms consistent with maxillary sinusitis. Patient exhibited a 3 week history of congestion that has progressed to sinus pain, headache, and occasional fevers. History is significant for possible double sickening. Transillumination was diminished on right side compared to left. - Augmentin twice a day 10 days - Encourage ibuprofen/Tylenol for fevers and pain. - Encourage adequate hydration. - Follow-up in 2 weeks if symptoms persist or worsen   Meds ordered this encounter  Medications  . amoxicillin-clavulanate (AUGMENTIN) 875-125 MG tablet    Sig: Take 1 tablet by mouth 2 (two) times daily.    Dispense:  20 tablet    Refill:  0  . fluconazole (DIFLUCAN) 150 MG tablet    Sig:  Take 1 tablet (150 mg total) by mouth once.    Dispense:  1 tablet    Refill:  0    Fill as needed for symptoms of yeast infection     Elberta Leatherwood, MD,MS,  PGY3 11/23/2015 12:22 PM

## 2015-11-23 NOTE — Patient Instructions (Signed)
It was a pleasure seeing you today in our clinic. Today we discussed your sinus infection. Here is the treatment plan we have discussed and agreed upon together:   - Augmentin. Take 1 tablet twice a day over the next 10 days. - Tylenol and/or ibuprofen. Take these as needed for sinus pain/discomfort and fevers. - Stay well-hydrated with water. - Your symptoms should dramatically improve over the next 3-5 days. Please take your antibiotic to completion (the full 10 day course). - You may notice that a cough may persist after your initial symptoms have resolved. This is relatively normal and can persist over a 3-4 week span.

## 2015-11-23 NOTE — Assessment & Plan Note (Signed)
Patient is here with signs and symptoms consistent with maxillary sinusitis. Patient exhibited a 3 week history of congestion that has progressed to sinus pain, headache, and occasional fevers. History is significant for possible double sickening. Transillumination was diminished on right side compared to left. - Augmentin twice a day 10 days - Encourage ibuprofen/Tylenol for fevers and pain. - Encourage adequate hydration. - Follow-up in 2 weeks if symptoms persist or worsen

## 2015-12-27 ENCOUNTER — Other Ambulatory Visit (HOSPITAL_COMMUNITY): Payer: Self-pay | Admitting: Obstetrics & Gynecology

## 2015-12-27 DIAGNOSIS — Z1231 Encounter for screening mammogram for malignant neoplasm of breast: Secondary | ICD-10-CM

## 2016-01-18 ENCOUNTER — Ambulatory Visit
Admission: RE | Admit: 2016-01-18 | Discharge: 2016-01-18 | Disposition: A | Discharge status: Home / Self Care | Payer: BLUE CROSS/BLUE SHIELD | Source: Ambulatory Visit | Attending: Obstetrics & Gynecology | Admitting: Obstetrics & Gynecology

## 2016-01-18 DIAGNOSIS — Z1231 Encounter for screening mammogram for malignant neoplasm of breast: Secondary | ICD-10-CM

## 2016-03-25 ENCOUNTER — Encounter: Payer: Self-pay | Admitting: Obstetrics & Gynecology

## 2016-03-25 ENCOUNTER — Ambulatory Visit (INDEPENDENT_AMBULATORY_CARE_PROVIDER_SITE_OTHER): Payer: BLUE CROSS/BLUE SHIELD | Admitting: Obstetrics & Gynecology

## 2016-03-25 VITALS — BP 148/98 | HR 101 | Ht 64.0 in | Wt 188.0 lb

## 2016-03-25 DIAGNOSIS — Z01419 Encounter for gynecological examination (general) (routine) without abnormal findings: Secondary | ICD-10-CM

## 2016-03-25 DIAGNOSIS — Z0000 Encounter for general adult medical examination without abnormal findings: Secondary | ICD-10-CM

## 2016-03-25 NOTE — Progress Notes (Signed)
Subjective:    Natalie Brady is a 46 y.o. MW P2 (grown kids)  female who presents for an annual exam. The patient has no complaints today. The patient is sexually active. GYN screening history: last pap: was normal. The patient wears seatbelts: yes. The patient participates in regular exercise: yes. (works out at [employer], [employer]) Has the patient ever been transfused or tattooed?: distant h/o blood transfusion. The patient reports that there is not domestic violence in her life.   Menstrual History: OB History    Gravida Para Term Preterm AB Living   2 2 2     2    SAB TAB Ectopic Multiple Live Births           2      Menarche age: 66, menopausal since 2016 Patient's last menstrual period was 01/09/2015.    The following portions of the patient's history were reviewed and updated as appropriate: allergies, current medications, past family history, past medical history, past social history, past surgical history and problem list.  Review of Systems Pertinent items are noted in HPI.   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 12/17 Has a FP for labs Declines flu vaccine   Objective:    BP (!) 148/98   Pulse (!) 101   Ht 5\' 4"  (1.626 m)   Wt 188 lb (85.3 kg)   LMP 01/09/2015 Comment: light  BMI 32.27 kg/m   General Appearance:    Alert, cooperative, no distress, appears stated age  Head:    Normocephalic, without obvious abnormality, atraumatic  Eyes:    PERRL, conjunctiva/corneas clear, EOM's intact, fundi    benign, both eyes  Ears:    Normal TM's and external ear canals, both ears  Nose:   Nares normal, septum midline, mucosa normal, no drainage    or sinus tenderness  Throat:   Lips, mucosa, and tongue normal; teeth and gums normal  Neck:   Supple, symmetrical, trachea midline, no adenopathy;    thyroid:  no enlargement/tenderness/nodules; no carotid   bruit or JVD  Back:     Symmetric, no curvature, ROM normal,  no CVA tenderness  Lungs:     Clear to auscultation bilaterally, respirations unlabored  Chest Wall:    No tenderness or deformity   Heart:    Regular rate and rhythm, S1 and S2 normal, no murmur, rub   or gallop  Breast Exam:    No tenderness, masses, or nipple abnormality  Abdomen:     Soft, non-tender, bowel sounds active all four quadrants,    no masses, no organomegaly  Genitalia:    Normal female without lesion, discharge or tenderness, moderate V V A, I cannot feel any pelvic masses but after her abdominoplasty, her abs are very tight and centripital obesity is a confounding factor. I offered to order an u/s to evaluate pelvic organs but she declines at this time.     Extremities:   Extremities normal, atraumatic, no cyanosis or edema  Pulses:   2+ and symmetric all extremities  Skin:   Skin color, texture, turgor normal, no rashes or lesions  Lymph nodes:   Cervical, supraclavicular, and axillary nodes normal  Neurologic:   CNII-XII intact, normal strength, sensation and reflexes    throughout  .    Assessment:    Healthy female exam.    Plan:     Thin prep Pap smear. with cotesting

## 2016-03-26 LAB — CYTOLOGY - PAP
DIAGNOSIS: NEGATIVE
HPV (WINDOPATH): NOT DETECTED

## 2016-05-10 DIAGNOSIS — Z0000 Encounter for general adult medical examination without abnormal findings: Secondary | ICD-10-CM | POA: Diagnosis not present

## 2016-05-23 DIAGNOSIS — E785 Hyperlipidemia, unspecified: Secondary | ICD-10-CM | POA: Diagnosis not present

## 2016-05-23 DIAGNOSIS — R739 Hyperglycemia, unspecified: Secondary | ICD-10-CM | POA: Diagnosis not present

## 2016-05-23 DIAGNOSIS — E559 Vitamin D deficiency, unspecified: Secondary | ICD-10-CM | POA: Diagnosis not present

## 2016-05-23 DIAGNOSIS — E119 Type 2 diabetes mellitus without complications: Secondary | ICD-10-CM | POA: Diagnosis not present

## 2016-05-23 DIAGNOSIS — Z0000 Encounter for general adult medical examination without abnormal findings: Secondary | ICD-10-CM | POA: Diagnosis not present

## 2016-07-15 DIAGNOSIS — H25013 Cortical age-related cataract, bilateral: Secondary | ICD-10-CM | POA: Diagnosis not present

## 2016-07-15 DIAGNOSIS — T378X5A Adverse effect of other specified systemic anti-infectives and antiparasitics, initial encounter: Secondary | ICD-10-CM | POA: Diagnosis not present

## 2016-07-15 DIAGNOSIS — Z79899 Other long term (current) drug therapy: Secondary | ICD-10-CM | POA: Diagnosis not present

## 2016-07-15 DIAGNOSIS — M069 Rheumatoid arthritis, unspecified: Secondary | ICD-10-CM | POA: Diagnosis not present

## 2016-08-12 DIAGNOSIS — Z79899 Other long term (current) drug therapy: Secondary | ICD-10-CM | POA: Diagnosis not present

## 2016-08-12 DIAGNOSIS — L4050 Arthropathic psoriasis, unspecified: Secondary | ICD-10-CM | POA: Diagnosis not present

## 2016-08-12 DIAGNOSIS — M79641 Pain in right hand: Secondary | ICD-10-CM | POA: Diagnosis not present

## 2016-08-12 DIAGNOSIS — E785 Hyperlipidemia, unspecified: Secondary | ICD-10-CM | POA: Diagnosis not present

## 2016-08-12 DIAGNOSIS — M19041 Primary osteoarthritis, right hand: Secondary | ICD-10-CM | POA: Diagnosis not present

## 2016-08-12 DIAGNOSIS — M0589 Other rheumatoid arthritis with rheumatoid factor of multiple sites: Secondary | ICD-10-CM | POA: Diagnosis not present

## 2016-08-12 DIAGNOSIS — E119 Type 2 diabetes mellitus without complications: Secondary | ICD-10-CM | POA: Diagnosis not present

## 2016-08-12 DIAGNOSIS — S92421A Displaced fracture of distal phalanx of right great toe, initial encounter for closed fracture: Secondary | ICD-10-CM | POA: Diagnosis not present

## 2016-11-05 ENCOUNTER — Ambulatory Visit (HOSPITAL_COMMUNITY)
Admission: EM | Admit: 2016-11-05 | Discharge: 2016-11-05 | Disposition: A | Discharge status: Home / Self Care | Payer: Worker's Compensation | Attending: Family Medicine | Admitting: Family Medicine

## 2016-11-05 ENCOUNTER — Ambulatory Visit (INDEPENDENT_AMBULATORY_CARE_PROVIDER_SITE_OTHER): Payer: Worker's Compensation

## 2016-11-05 ENCOUNTER — Encounter (HOSPITAL_COMMUNITY): Payer: Self-pay | Admitting: Emergency Medicine

## 2016-11-05 DIAGNOSIS — R2231 Localized swelling, mass and lump, right upper limb: Secondary | ICD-10-CM | POA: Diagnosis not present

## 2016-11-05 DIAGNOSIS — S63502A Unspecified sprain of left wrist, initial encounter: Secondary | ICD-10-CM | POA: Diagnosis not present

## 2016-11-05 DIAGNOSIS — M79644 Pain in right finger(s): Secondary | ICD-10-CM

## 2016-11-05 DIAGNOSIS — S6991XA Unspecified injury of right wrist, hand and finger(s), initial encounter: Secondary | ICD-10-CM

## 2016-11-05 NOTE — Discharge Instructions (Signed)
You may use over the counter ibuprofen or acetaminophen as needed.  ° °

## 2016-11-05 NOTE — ED Triage Notes (Signed)
Pt here for left wrist pain and right middle finger pain after lifting patient while at work today

## 2016-11-06 NOTE — ED Provider Notes (Signed)
[place]   [id number] 11/05/16 Arrival Time: 9476  ASSESSMENT & PLAN:  1. Sprain of left wrist, initial encounter   2. Finger injury, right, initial encounter    To f/u with [facility] for further evaluation and management. Placed in wrist splint. No finger fracture seen. Ibuprofen and ice tonight. Work note given.  Reviewed expectations re: course of current medical issues. Questions answered. Outlined signs and symptoms indicating need for more acute intervention. Patient verbalized understanding. After Visit Summary given.   SUBJECTIVE:  Natalie Brady is a 46 y.o. 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 Reports feeling her L wrist "twist" with resulting discomfort over wrist/thumb. Mild swelling that is improving. Also reports feeling her L 3rd finger "pop" at the same time. No worsening. Decreased ROM (mild) of L 3rd finger. No extremity sensation changes or weakness. No specific aggravating or alleviating factors reported. No self-treatment. No similar, previous injury reported.  ROS: As per HPI.   OBJECTIVE:  Vitals:   11/05/16 1728  BP: (!) 147/66  Pulse: 89  Resp: 18  Temp: 97.9 F (36.6 C)  TempSrc: Oral  SpO2: 97%    General appearance: alert; no distress Extremities: no cyanosis or edema; symmetrical with no gross deformities; tenderness over her right 3rd finger (noted rheum arthritic changes) with no swelling and no bruising; demonstrates decreased flexion; also with very mild tenderness over wrist at thumb and without bony tenderness: FROM; normal strength Skin: warm and dry Psychological: alert and cooperative; normal mood and affect  Imaging: Dg Finger Middle Right  Result Date: 11/05/2016 CLINICAL DATA:  Initial evaluation for acute pain and swelling ab right middle finger status post fall. EXAM: RIGHT MIDDLE FINGER 2+V COMPARISON:   None. FINDINGS: No acute fracture or dislocation. Advanced degenerative osteoarthrosis present at the right third PIP and DIP joints with associated soft tissue swelling. Osseous mineralization normal. No other appreciable soft tissue injury. IMPRESSION: 1. No acute fracture or dislocation. 2. Advanced degenerative osteoarthrosis at the right third DIP and PIP joints with associated soft tissue swelling. Electronically Signed   By: Jeannine Boga M.D.   On: 11/05/2016 18:28    No Known Allergies  Past Medical History:  Diagnosis Date  . Arthritis   . Celiac disease 2008  . Chronic headaches   . GERD (gastroesophageal reflux disease)   . Melanoma [facility])    2005 surgery    Social History   Social History  . Marital status: Married    Spouse name: N/A  . Number of children: 2  . Years of education: N/A   Occupational History  . coach     tennis soccer   Social History Main Topics  . Smoking status: Never Smoker  . Smokeless tobacco: Never Used  . Alcohol use No  . Drug use: No  . Sexual activity: Yes    Partners: Male    Birth control/ protection: Surgical   Other Topics Concern  . Not on file   Social History Narrative   [place]         Family History  Problem Relation Age of Onset  . Diabetes Mother   . Hypertension Mother   . Diabetes Father   . Coronary artery disease Maternal Grandmother   . Coronary artery disease Paternal Grandmother   . Stomach cancer Paternal Grandfather    Past  Surgical History:  Procedure Laterality Date  . BREAST REDUCTION SURGERY    . CESAREAN SECTION     93 96  . LIPOSUCTION    . TUBAL LIGATION        Vanessa Kick, MD 11/06/16 [phone]

## 2016-11-12 DIAGNOSIS — M79641 Pain in right hand: Secondary | ICD-10-CM | POA: Diagnosis not present

## 2016-11-12 DIAGNOSIS — L4050 Arthropathic psoriasis, unspecified: Secondary | ICD-10-CM | POA: Diagnosis not present

## 2016-11-12 DIAGNOSIS — Z79899 Other long term (current) drug therapy: Secondary | ICD-10-CM | POA: Diagnosis not present

## 2016-11-12 DIAGNOSIS — M0589 Other rheumatoid arthritis with rheumatoid factor of multiple sites: Secondary | ICD-10-CM | POA: Diagnosis not present

## 2017-02-24 DIAGNOSIS — L4050 Arthropathic psoriasis, unspecified: Secondary | ICD-10-CM | POA: Diagnosis not present

## 2017-02-24 DIAGNOSIS — M79641 Pain in right hand: Secondary | ICD-10-CM | POA: Diagnosis not present

## 2017-02-24 DIAGNOSIS — M0589 Other rheumatoid arthritis with rheumatoid factor of multiple sites: Secondary | ICD-10-CM | POA: Diagnosis not present

## 2017-03-30 ENCOUNTER — Emergency Department (HOSPITAL_COMMUNITY)
Admission: EM | Admit: 2017-03-30 | Discharge: 2017-03-30 | Disposition: A | Discharge status: Home / Self Care | Payer: 59 | Attending: Physician Assistant | Admitting: Physician Assistant

## 2017-03-30 ENCOUNTER — Encounter (HOSPITAL_COMMUNITY): Payer: Self-pay | Admitting: Emergency Medicine

## 2017-03-30 DIAGNOSIS — Z79899 Other long term (current) drug therapy: Secondary | ICD-10-CM | POA: Insufficient documentation

## 2017-03-30 DIAGNOSIS — R42 Dizziness and giddiness: Secondary | ICD-10-CM

## 2017-03-30 DIAGNOSIS — R6889 Other general symptoms and signs: Secondary | ICD-10-CM | POA: Diagnosis not present

## 2017-03-30 DIAGNOSIS — R197 Diarrhea, unspecified: Secondary | ICD-10-CM | POA: Diagnosis not present

## 2017-03-30 DIAGNOSIS — Z85820 Personal history of malignant melanoma of skin: Secondary | ICD-10-CM | POA: Diagnosis not present

## 2017-03-30 DIAGNOSIS — R059 Cough, unspecified: Secondary | ICD-10-CM

## 2017-03-30 DIAGNOSIS — R112 Nausea with vomiting, unspecified: Secondary | ICD-10-CM | POA: Diagnosis not present

## 2017-03-30 DIAGNOSIS — J069 Acute upper respiratory infection, unspecified: Secondary | ICD-10-CM | POA: Insufficient documentation

## 2017-03-30 DIAGNOSIS — R05 Cough: Secondary | ICD-10-CM

## 2017-03-30 DIAGNOSIS — R404 Transient alteration of awareness: Secondary | ICD-10-CM | POA: Diagnosis not present

## 2017-03-30 MED ORDER — SODIUM CHLORIDE 0.9 % IV BOLUS (SEPSIS)
2000.0000 mL | Freq: Once | INTRAVENOUS | Status: AC
  Administered 2017-03-30: 1000 mL via INTRAVENOUS

## 2017-03-30 MED ORDER — ONDANSETRON 4 MG PO TBDP: 4.0000 mg | ORAL_TABLET | Freq: Three times a day (TID) | ORAL | 0 refills | Status: DC | PRN

## 2017-03-30 MED ORDER — SODIUM CHLORIDE 0.9 % IV BOLUS (SEPSIS): 1000.0000 mL | Freq: Once | INTRAVENOUS | Status: DC

## 2017-03-30 MED ORDER — ACETAMINOPHEN 500 MG PO TABS
1000.0000 mg | ORAL_TABLET | Freq: Once | ORAL | Status: AC
  Administered 2017-03-30: 1000 mg via ORAL
  Filled 2017-03-30: qty 2

## 2017-03-30 MED ORDER — ONDANSETRON HCL 4 MG/2ML IJ SOLN
4.0000 mg | Freq: Once | INTRAMUSCULAR | Status: AC
  Administered 2017-03-30: 4 mg via INTRAVENOUS
  Filled 2017-03-30: qty 2

## 2017-03-30 NOTE — Discharge Instructions (Signed)
Continue to stay VERY well-hydrated, drinking enough water or gatorade to make your urine almost clear.  Use zofran as directed as needed for nausea.  Gargle warm salt water and spit it out and use chloraseptic spray as needed for sore throat. Continue to alternate between Tylenol and Ibuprofen for pain or fever. Use Mucinex for cough suppression/expectoration of mucus. Use netipot and flonase to help with nasal congestion. May consider over-the-counter Benadryl or other antihistamine to decrease secretions and for help with your symptoms. Continue taking your tamiflu and Zpak as directed until completed.  May consider using over the counter tums, maalox, pepto bismol, or other over the counter remedies to help with nausea/diarrhea symptoms. Follow a BRAT (banana-rice-applesauce-toast) diet as described below for the next 24-48 hours. The 'BRAT' diet is suggested, then progress to diet as tolerated as symptoms abate. Call your regular doctor if bloody stools, persistent diarrhea, vomiting, fever or abdominal pain.  Follow up with your primary care doctor in 5-7 days for recheck of ongoing symptoms. Return to emergency department for emergent changing or worsening of symptoms.

## 2017-03-30 NOTE — ED Triage Notes (Signed)
Patient here via EMS from home with complaints of generalized body aches. Reports that she was seen by PCP and given tamiful and antibiotics no relief.

## 2017-03-30 NOTE — ED Provider Notes (Signed)
[facility] DEPT Provider Note   CSN: [id number] Arrival date & time: 03/30/17  1306     History   Chief Complaint Chief Complaint  Patient presents with  . Generalized Body Aches    HPI Natalie Brady is a 47 y.o. female with a PMHx of celiac disease, chronic headaches, GERD, melanoma, RA on xeljanz, HTN, and other conditions listed below, who presents to the ED with complaints of flulike symptoms x 3 days.  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  On Thursday 3 days ago she developed flulike symptoms including: Nausea, one episode of nonbloody nonbilious emesis on Friday however none since then, 2 episodes of nonbloody looser than normal diarrhea, nasal congestion, clear rhinorrhea, dry cough, headaches, fever with Tmax 100.9, and sore throat.  She also had transient left ear pain which resolved.  She has been using Tylenol and ibuprofen with some relief of her symptoms (hasn't taken any since last night), no known aggravating factors.  Two days ago her PCP Dr. Jani Gravel called in Tamiflu and a Z-Pak which she has been taking.  Today she noticed when she stood up or sit up she had some lightheadedness, which improved when she laid down.  She checked her blood pressure at home and noticed she had orthostatic vitals therefore she called EMS, they gave her 250 mL bolus in route which has helped somewhat.  She states that she has been trying to stay hydrated however she thinks that it is possible she could become dehydrated due to the flulike symptoms she has been having.  She denies any chills, ongoing ear pain, ear drainage, drooling, trismus, chest pain, shortness of breath, wheezing, abdominal pain, hematemesis, melena, hematochezia, constipation, obstipation, dysuria, hematuria, myalgias, arthralgias, numbness, tingling, focal weakness, vision changes, neck stiffness, or any other complaints at this time.  She is a  non-smoker.   The history is provided by the patient and medical records. No language interpreter was used.  URI   Associated symptoms include diarrhea, nausea, vomiting, congestion, headaches, rhinorrhea, sore throat and cough. Pertinent negatives include no chest pain, no abdominal pain, no dysuria, no ear pain and no wheezing.  Dizziness  Quality:  Lightheadedness Severity:  Mild Onset quality:  Gradual Duration:  12 hours Timing:  Intermittent Progression:  Waxing and waning Chronicity:  New Context: standing up   Relieved by:  Lying down and fluids Worsened by:  Sitting upright and standing up Ineffective treatments:  None tried Associated symptoms: diarrhea, headaches, nausea and vomiting   Associated symptoms: no blood in stool, no chest pain, no shortness of breath, no vision changes and no weakness     Past Medical History:  Diagnosis Date  . Arthritis   . Celiac disease 2008  . Chronic headaches   . GERD (gastroesophageal reflux disease)   . Melanoma [facility])    2005 surgery     Patient Active Problem List   Diagnosis Date Noted  . Sinusitis, acute maxillary 11/23/2015  . Ear pain 09/01/2014  . Insomnia 09/01/2014  . Rheumatoid arthritis ([place]) 01/12/2013  . Celiac sprue 10/23/2010  . ELEVATED BLOOD PRESSURE WITHOUT DIAGNOSIS OF HYPERTENSION 08/08/2009  . ALLERGIC RHINITIS 01/19/2009    Past Surgical History:  Procedure Laterality Date  . BREAST REDUCTION SURGERY    . CESAREAN SECTION     93 96  . LIPOSUCTION    . TUBAL LIGATION      OB History  Gravida Para Term Preterm AB Living   2 2 2     2    SAB TAB Ectopic Multiple Live Births           2       Home Medications    Prior to Admission medications   Medication Sig Start Date End Date Taking? Authorizing Provider  Adalimumab (HUMIRA PEN [place]) Inject into the skin.    [provider]  hydroxychloroquine (PLAQUENIL) 200 MG tablet  01/08/16   [provider]  leflunomide (ARAVA)  20 MG tablet Take 20 mg by mouth daily.     [provider]  temazepam (RESTORIL) 15 MG capsule Take 1 capsule (15 mg total) by mouth at bedtime as needed. For sleep. 09/01/14   Rosemarie Ax, MD    Family History Family History  Problem Relation Age of Onset  . Diabetes Mother   . Hypertension Mother   . Diabetes Father   . Coronary artery disease Maternal Grandmother   . Coronary artery disease Paternal Grandmother   . Stomach cancer Paternal Grandfather     Social History Social History   Tobacco Use  . Smoking status: Never Smoker  . Smokeless tobacco: Never Used  Substance Use Topics  . Alcohol use: No  . Drug use: No     Allergies   Patient has no known allergies.   Review of Systems Review of Systems  Constitutional: Positive for fever (Tmax 100.9). Negative for chills.  HENT: Positive for congestion, rhinorrhea and sore throat. Negative for drooling, ear discharge, ear pain and trouble swallowing.   Eyes: Negative for visual disturbance.  Respiratory: Positive for cough. Negative for shortness of breath and wheezing.   Cardiovascular: Negative for chest pain.  Gastrointestinal: Positive for diarrhea, nausea and vomiting. Negative for abdominal pain, blood in stool and constipation.  Genitourinary: Negative for dysuria and hematuria.  Musculoskeletal: Negative for arthralgias, myalgias and neck stiffness.  Skin: Negative for color change.  Allergic/Immunologic: Positive for immunocompromised state (on xeljanz).  Neurological: Positive for dizziness, light-headedness and headaches. Negative for syncope, weakness and numbness.  Psychiatric/Behavioral: Negative for confusion.   All other systems reviewed and are negative for acute change except as noted in the HPI.    Physical Exam Updated Vital Signs BP (!) 149/96 (BP Location: Left Arm)   Pulse (!) 119   Temp 99.3 F (37.4 C) (Oral)   Resp 19   LMP 01/09/2015 Comment: light  SpO2 96%    Physical Exam  Constitutional: She is oriented to person, place, and time. Vital signs are normal. She appears well-developed and well-nourished.  Non-toxic appearance. No distress.  Afebrile, nontoxic, NAD  HENT:  Head: Normocephalic and atraumatic.  Right Ear: Hearing, tympanic membrane, external ear and ear canal normal.  Left Ear: Hearing, external ear and ear canal normal. Tympanic membrane is injected and bulging. A middle ear effusion is present.  Nose: Mucosal edema and rhinorrhea present.  Mouth/Throat: Uvula is midline and oropharynx is clear and moist. Mucous membranes are dry. No trismus in the jaw. No uvula swelling. Tonsils are 0 on the right. Tonsils are 0 on the left. No tonsillar exudate.  R ear clear, L ear with clear canal but TM mildly injected and slightly bulging with serous effusion noted. Nose with mild mucosal edema and rhinorrhea. Lips and mucous membranes slightly dry. Oropharynx clear without uvular swelling or deviation, no trismus or drooling, no tonsillar swelling, no exudates. No PTA   Eyes: Conjunctivae and EOM  are normal. Right eye exhibits no discharge. Left eye exhibits no discharge.  Neck: Normal range of motion. Neck supple. No spinous process tenderness and no muscular tenderness present. No neck rigidity. Normal range of motion present.  No meningismus  Cardiovascular: Regular rhythm, normal heart sounds and intact distal pulses. Tachycardia present. Exam reveals no gallop and no friction rub.  No murmur heard. Mildly tachycardic  Pulmonary/Chest: Effort normal and breath sounds normal. No respiratory distress. She has no decreased breath sounds. She has no wheezes. She has no rhonchi. She has no rales.  CTAB in all lung fields, no w/r/r, no hypoxia or increased WOB, speaking in full sentences, SpO2 96% on RA   Abdominal: Soft. Normal appearance and bowel sounds are normal. She exhibits no distension. There is no tenderness. There is no rigidity, no  rebound, no guarding, no CVA tenderness, no tenderness at McBurney's point and negative Murphy's sign.  Musculoskeletal: Normal range of motion.  MAE x4 Strength and sensation grossly intact in all extremities Distal pulses intact  Lymphadenopathy:       Head (right side): No submandibular and no tonsillar adenopathy present.       Head (left side): No submandibular and no tonsillar adenopathy present.    She has cervical adenopathy.  Shotty cervical LAD bilaterally  Neurological: She is alert and oriented to person, place, and time. She has normal strength. No sensory deficit.  Skin: Skin is warm, dry and intact. No rash noted.  Psychiatric: She has a normal mood and affect.  Nursing note and vitals reviewed.    ED Treatments / Results  Labs (all labs ordered are listed, but only abnormal results are displayed) Labs Reviewed - No data to display  EKG  EKG Interpretation None       Radiology No results found.  Procedures Procedures (including critical care time)  Medications Ordered in ED Medications  acetaminophen (TYLENOL) tablet 1,000 mg (1,000 mg Oral Given 03/30/17 1455)  ondansetron (ZOFRAN) injection 4 mg (4 mg Intravenous Given 03/30/17 1501)  sodium chloride 0.9 % bolus 2,000 mL (1,000 mLs Intravenous New Bag/Given 03/30/17 1501)     Initial Impression / Assessment and Plan / ED Course  I have reviewed the triage vital signs and the nursing notes.  Pertinent labs & imaging results that were available during my care of the patient were reviewed by me and considered in my medical decision making (see chart for details).     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 Given 265mL bolus en route which helped slightly. On exam, dry lips and mucous membranes, L ear with mild serous effusion and slight TM injection, R ear  clear, throat clear, nose congested, scattered cervical LAD, clear lungs, mildly tachycardic, low grade temp 99.3 but feels warm, no abdominal tenderness, and overall well appearing. Thinks she probably got dehydrated. Overall, symptoms consistent with the flu, looks clinically slightly dehydrated, which matches her orthostatic VS picture. However she is not septic appearing and does not have any concerning exam findings that would indicate need for further emergent work up today. She's already on abx that would treat if she had PNA or bacterial etiology, therefore doubt need for investigating labs/imaging. Already on tamiflu, doubt need for testing for this. Will give tylenol, zofran, and fluids, then reassess.   6:10 PM Pt feeling much better and  VS improving after fluids; tolerating PO well. No longer lightheaded. Overall, symptoms most consistent with influenza like illness, and mild dehydration causing the orthostatic lightheadedness. Advised importance of increased fluid intake during febrile illness. Other OTC remedies for symptomatic relief discussed. Advised continuation of tamiflu and zpak. Will rx zofran. Advised DASH diet for diarrhea. F/up with PCP in 5-7 days for recheck of symptoms. I explained the diagnosis and have given explicit precautions to return to the ER including for any other new or worsening symptoms. The patient understands and accepts the medical plan as it's been dictated and I have answered their questions. Discharge instructions concerning home care and prescriptions have been given. The patient is STABLE and is discharged to home in good condition.    Final Clinical Impressions(s) / ED Diagnoses   Final diagnoses:  Flu-like symptoms  Orthostatic lightheadedness  Nausea vomiting and diarrhea  Cough  Upper respiratory tract infection, unspecified type    ED Discharge Orders        Ordered    ondansetron (ZOFRAN ODT) 4 MG disintegrating tablet  Every 8 hours PRN      03/30/17 [address], [place], [place] 03/30/17 1810    [place], Fredia Sorrow, MD 03/31/17 1541

## 2017-03-30 NOTE — ED Notes (Signed)
Bed: [facility] Expected date:  Expected time:  Means of arrival:  Comments: 

## 2017-03-30 NOTE — ED Notes (Signed)
1 staff assist using bedside commode, pt became dizzy when up to [facility] and back.

## 2017-04-01 DIAGNOSIS — M79641 Pain in right hand: Secondary | ICD-10-CM | POA: Diagnosis not present

## 2017-04-01 DIAGNOSIS — L4050 Arthropathic psoriasis, unspecified: Secondary | ICD-10-CM | POA: Diagnosis not present

## 2017-04-01 DIAGNOSIS — M0589 Other rheumatoid arthritis with rheumatoid factor of multiple sites: Secondary | ICD-10-CM | POA: Diagnosis not present

## 2017-05-09 ENCOUNTER — Ambulatory Visit (INDEPENDENT_AMBULATORY_CARE_PROVIDER_SITE_OTHER): Payer: 59

## 2017-05-09 ENCOUNTER — Encounter (INDEPENDENT_AMBULATORY_CARE_PROVIDER_SITE_OTHER): Payer: Self-pay | Admitting: Orthopedic Surgery

## 2017-05-09 ENCOUNTER — Ambulatory Visit (INDEPENDENT_AMBULATORY_CARE_PROVIDER_SITE_OTHER): Payer: 59 | Admitting: Orthopedic Surgery

## 2017-05-09 DIAGNOSIS — M1711 Unilateral primary osteoarthritis, right knee: Secondary | ICD-10-CM | POA: Diagnosis not present

## 2017-05-09 DIAGNOSIS — M25561 Pain in right knee: Secondary | ICD-10-CM

## 2017-05-09 MED ORDER — BUPIVACAINE HCL 0.25 % IJ SOLN
4.0000 mL | INTRAMUSCULAR | Status: AC | PRN
  Administered 2017-05-09: 4 mL via INTRA_ARTICULAR

## 2017-05-09 MED ORDER — LIDOCAINE HCL 1 % IJ SOLN
5.0000 mL | INTRAMUSCULAR | Status: AC | PRN
  Administered 2017-05-09: 5 mL

## 2017-05-09 MED ORDER — METHYLPREDNISOLONE ACETATE 40 MG/ML IJ SUSP
40.0000 mg | INTRAMUSCULAR | Status: AC | PRN
  Administered 2017-05-09: 40 mg via INTRA_ARTICULAR

## 2017-05-09 NOTE — Progress Notes (Addendum)
Office Visit Note   Patient: Natalie Brady           Date of Birth: September 14, 1970           MRN: [id number] Visit Date: 05/09/2017 Requested by: Jani Gravel, MD [place] [place], [place] [address] PCP: Jani Gravel, MD  Subjective: Chief Complaint  Patient presents with  . Right Knee - Pain    HPI: Natalie Brady is a patient with right knee pain.'s been going on 2-3 months.  She has a known history of rheumatoid arthritis and has tried Humira Enbrel and methotrexate and is currently on [place].  She has some issues with the right third PIP joint as well.  She works as a Public relations account executive at [employer].  She denies any mechanical symptoms in the knee but just reports pain.              ROS: All systems reviewed are negative as they relate to the chief complaint within the history of present illness.  Patient denies  fevers or chills.   Assessment & Plan: Visit Diagnoses:  1. Right knee pain, unspecified chronicity   2. Unilateral primary osteoarthritis, right knee     Plan: Impression is right knee pain rheumatoid arthritis and synovitis.  Radiographs look pretty good but she is having pain and effusion in the right knee.  Knee is aspirated and injected today.  We want to put off knee replacement as long as possible in this patient.  Hopefully she can find some agent which will give her some relief from her rheumatoid arthritis.  Follow-Up Instructions: Return if symptoms worsen or fail to improve.   Orders:  Orders Placed This Encounter  Procedures  . XR KNEE 3 VIEW RIGHT   No orders of the defined types were placed in this encounter.     Procedures: Large Joint Inj: R knee on 05/09/2017 6:47 PM Indications: diagnostic evaluation, joint swelling and pain Details: 18 G 1.5 in needle, superolateral approach  Arthrogram: No  Medications: 5 mL lidocaine 1 %; 40 mg methylPREDNISolone acetate 40 MG/ML; 4 mL bupivacaine 0.25 % Aspirate: 30 mL yellow Outcome: tolerated well, no immediate  complications Procedure, treatment alternatives, risks and benefits explained, specific risks discussed. Consent was given by the patient. Immediately prior to procedure a time out was called to verify the correct patient, procedure, equipment, support staff and site/side marked as required. Patient was prepped and draped in the usual sterile fashion.       Clinical Data: No additional findings.  Objective: Vital Signs: LMP 01/09/2015 Comment: light  Physical Exam:   Constitutional: Patient appears well-developed HEENT:  Head: Normocephalic Eyes:EOM are normal Neck: Normal range of motion Cardiovascular: Normal rate Pulmonary/chest: Effort normal Neurologic: Patient is alert Skin: Skin is warm Psychiatric: Patient has normal mood and affect    Ortho Exam: Orthopedic exam does report shows some swelling of the right third PIP joint.  Right knee is examined and she does have an effusion plus synovitis.  No groin pain with internal/external rotation of either leg.  Left knee has no effusion.  Range of motion excellent in the knee on the right-hand side with stable collateral cruciate ligaments intact extensor mechanism.  No other masses lymph adenopathy or skin changes noted in the right knee region.  Specialty Comments:  No specialty comments available.  Imaging: Xr Knee 3 View Right  Result Date: 05/09/2017 AP lateral merchant right knee reviewed.  No spurring is present.  Mild joint space  narrowing seen in the medial and lateral compartments.  Slight narrowing of the patellofemoral compartment over that lateral facet on the right-hand side.  Mild effusion present    PMFS History: Patient Active Problem List   Diagnosis Date Noted  . Sinusitis, acute maxillary 11/23/2015  . Ear pain 09/01/2014  . Insomnia 09/01/2014  . Rheumatoid arthritis ([place]) 01/12/2013  . Celiac sprue 10/23/2010  . ELEVATED BLOOD PRESSURE WITHOUT DIAGNOSIS OF HYPERTENSION 08/08/2009  . ALLERGIC  RHINITIS 01/19/2009   Past Medical History:  Diagnosis Date  . Arthritis   . Celiac disease 2008  . Chronic headaches   . GERD (gastroesophageal reflux disease)   . Melanoma ([place])    2005 surgery     Family History  Problem Relation Age of Onset  . Diabetes Mother   . Hypertension Mother   . Diabetes Father   . Coronary artery disease Maternal Grandmother   . Coronary artery disease Paternal Grandmother   . Stomach cancer Paternal Grandfather     Past Surgical History:  Procedure Laterality Date  . BREAST REDUCTION SURGERY    . CESAREAN SECTION     93 96  . LIPOSUCTION    . TUBAL LIGATION     Social History   Occupational History  . Occupation: Leisure centre manager    Comment: tennis soccer  Tobacco Use  . Smoking status: Never Smoker  . Smokeless tobacco: Never Used  Substance and Sexual Activity  . Alcohol use: No  . Drug use: No  . Sexual activity: Yes    Partners: Male    Birth control/protection: Surgical

## 2017-06-05 DIAGNOSIS — M0589 Other rheumatoid arthritis with rheumatoid factor of multiple sites: Secondary | ICD-10-CM | POA: Diagnosis not present

## 2017-06-05 DIAGNOSIS — L4050 Arthropathic psoriasis, unspecified: Secondary | ICD-10-CM | POA: Diagnosis not present

## 2017-06-05 DIAGNOSIS — M25539 Pain in unspecified wrist: Secondary | ICD-10-CM | POA: Diagnosis not present

## 2017-10-01 DIAGNOSIS — S42432A Displaced fracture (avulsion) of lateral epicondyle of left humerus, initial encounter for closed fracture: Secondary | ICD-10-CM | POA: Diagnosis not present

## 2017-10-01 DIAGNOSIS — M25522 Pain in left elbow: Secondary | ICD-10-CM | POA: Diagnosis not present

## 2017-10-01 DIAGNOSIS — S42402A Unspecified fracture of lower end of left humerus, initial encounter for closed fracture: Secondary | ICD-10-CM | POA: Diagnosis not present

## 2017-10-02 ENCOUNTER — Ambulatory Visit (INDEPENDENT_AMBULATORY_CARE_PROVIDER_SITE_OTHER): Payer: BLUE CROSS/BLUE SHIELD | Admitting: Family Medicine

## 2017-10-02 ENCOUNTER — Encounter (INDEPENDENT_AMBULATORY_CARE_PROVIDER_SITE_OTHER): Payer: Self-pay | Admitting: Family Medicine

## 2017-10-02 DIAGNOSIS — S42432A Displaced fracture (avulsion) of lateral epicondyle of left humerus, initial encounter for closed fracture: Secondary | ICD-10-CM

## 2017-10-02 DIAGNOSIS — M25522 Pain in left elbow: Secondary | ICD-10-CM

## 2017-10-02 MED ORDER — HYDROCODONE-ACETAMINOPHEN 5-325 MG PO TABS: 1.0000 | ORAL_TABLET | Freq: Four times a day (QID) | ORAL | 0 refills | Status: DC | PRN

## 2017-10-02 NOTE — Progress Notes (Signed)
Office Visit Note   Patient: Natalie Brady           Date of Birth: 1970/05/21           MRN: [id number]  Visit Date: 10/02/2017 Requested by: Jani Gravel, MD [address] [place], [place] [address] PCP: Jani Gravel, MD  Subjective: Chief Complaint  Patient presents with  . Left Elbow - Pain    HPI: She is a 47 year old right-hand-dominant female with left elbow fracture.  About 1 week ago at work, working as an Public relations account executive, a Biomedical scientist came down on her arm and she felt immediate pain on the lateral aspect of her left elbow.  Pain was manageable, but then a few days later at home her husband started to fall and she caught him and felt a pop in the same area of her elbow.  Since then it has been extremely painful, bringing her to tears.  She went to urgent care yesterday where x-rays revealed an avulsion fracture at the lateral epicondyle of the humerus and she now presents for evaluation.  No previous problems with her elbow.  She does have a history of rheumatoid arthritis.              ROS: Otherwise noncontributory  Objective: Vital Signs: LMP 01/09/2015 Comment: light  Physical Exam:  Left elbow: Moderate bruising and swelling on the lateral aspect.  Very limited range of motion in the elbow because of pain.  Exquisitely tender at the lateral epicondyle.  Pain but intact wrist extension.  Imaging: X-rays brought with her on CD reveal a displaced avulsion fracture fragment from the lateral epicondyle.  No other fracture seen.   Assessment & Plan: 1.  Left elbow pain due to avulsion fracture of the lateral epicondyle -We will order MRI scan as soon as possible to assess tendon and determine whether surgical intervention is needed. -Sling for comfort, hydrocodone for pain, out of work until further notice.   Follow-Up Instructions: No follow-ups on file.     Procedures: None today.   PMFS History: Patient Active Problem List   Diagnosis Date Noted  . Sinusitis,  acute maxillary 11/23/2015  . Ear pain 09/01/2014  . Insomnia 09/01/2014  . Rheumatoid arthritis ([place]) 01/12/2013  . Celiac sprue 10/23/2010  . ELEVATED BLOOD PRESSURE WITHOUT DIAGNOSIS OF HYPERTENSION 08/08/2009  . ALLERGIC RHINITIS 01/19/2009   Past Medical History:  Diagnosis Date  . Arthritis   . Celiac disease 2008  . Chronic headaches   . GERD (gastroesophageal reflux disease)   . Melanoma ([place])    2005 surgery     Family History  Problem Relation Age of Onset  . Diabetes Mother   . Hypertension Mother   . Diabetes Father   . Coronary artery disease Maternal Grandmother   . Coronary artery disease Paternal Grandmother   . Stomach cancer Paternal Grandfather     Past Surgical History:  Procedure Laterality Date  . BREAST REDUCTION SURGERY    . CESAREAN SECTION     93 96  . LIPOSUCTION    . TUBAL LIGATION     Social History   Occupational History  . Occupation: Leisure centre manager    Comment: tennis soccer  Tobacco Use  . Smoking status: Never Smoker  . Smokeless tobacco: Never Used  Substance and Sexual Activity  . Alcohol use: No  . Drug use: No  . Sexual activity: Yes    Partners: Male    Birth control/protection: Surgical

## 2017-10-04 ENCOUNTER — Ambulatory Visit (HOSPITAL_COMMUNITY)
Admission: RE | Admit: 2017-10-04 | Discharge: 2017-10-04 | Disposition: A | Discharge status: Home / Self Care | Payer: BLUE CROSS/BLUE SHIELD | Source: Ambulatory Visit | Attending: Family Medicine | Admitting: Family Medicine

## 2017-10-04 DIAGNOSIS — X58XXXA Exposure to other specified factors, initial encounter: Secondary | ICD-10-CM | POA: Diagnosis not present

## 2017-10-04 DIAGNOSIS — M25522 Pain in left elbow: Secondary | ICD-10-CM | POA: Diagnosis not present

## 2017-10-04 DIAGNOSIS — S56512A Strain of other extensor muscle, fascia and tendon at forearm level, left arm, initial encounter: Secondary | ICD-10-CM | POA: Diagnosis not present

## 2017-10-04 DIAGNOSIS — R609 Edema, unspecified: Secondary | ICD-10-CM | POA: Diagnosis not present

## 2017-10-07 ENCOUNTER — Telehealth (INDEPENDENT_AMBULATORY_CARE_PROVIDER_SITE_OTHER): Payer: Self-pay | Admitting: Family Medicine

## 2017-10-07 NOTE — Telephone Encounter (Signed)
MRI shows that the tendon does not look torn.  There is no clear-cut need for surgery based on MRI results.  Would like to see her for recheck this week to decide when we can start working on range of motion.

## 2017-10-07 NOTE — Telephone Encounter (Signed)
Advised patient of results.  She has an appointment for tomorrow morning with Dr. Junius Roads for a recheck of the elbow.

## 2017-10-08 ENCOUNTER — Ambulatory Visit (INDEPENDENT_AMBULATORY_CARE_PROVIDER_SITE_OTHER): Payer: BLUE CROSS/BLUE SHIELD | Admitting: Family Medicine

## 2017-10-08 ENCOUNTER — Encounter (INDEPENDENT_AMBULATORY_CARE_PROVIDER_SITE_OTHER): Payer: Self-pay | Admitting: Family Medicine

## 2017-10-08 DIAGNOSIS — S42432D Displaced fracture (avulsion) of lateral epicondyle of left humerus, subsequent encounter for fracture with routine healing: Secondary | ICD-10-CM

## 2017-10-08 NOTE — Progress Notes (Signed)
   Office Visit Note   Patient: Natalie Brady           Date of Birth: 1970/03/17           MRN: [id number] Visit Date: 10/08/2017 Requested by: Jani Gravel, MD [address] [place], [place] [address] PCP: Jani Gravel, MD  Subjective: Chief Complaint  Patient presents with  . Left Elbow - Follow-up  . Left Hand - Pain    Fell in the laundry room 10/05/17, injuring left hand. Pain and swelling in the hand.    HPI: She is now about 1-1/2 weeks status post left arm injury resulting in avulsion fracture of lateral epicondyle of the humerus.  Recent MRI scan showed no damage to the common extensor tendon.  Her pain is improving although she fell again yesterday and landed on her arm.  She remains out of work.               ROS: Otherwise noncontributory.  Objective: Vital Signs: LMP 01/09/2015 Comment: light  Physical Exam:  Left arm: Slight bruising dorsal forearm with minimal soft tissue tenderness there.  Still tender at the lateral epicondyle but much less than before.  Almost able to fully extend her elbow, and almost able to fully flex it.  Pronation and supination of the forearm are normal.  She has some pain with wrist extension against resistance.  Imaging: I briefly imaged her lateral elbow with ultrasound and the common extensor tendon is intact, the avulsion fragment is within the tendon.  Assessment & Plan: 1.  1-1/2 weeks status post left arm lateral epicondyle avulsion fracture, clinically healing -Keep working on range of motion.  Start light duty, desk work beginning Monday.  Return in 3 weeks for recheck.  2 view elbow x-rays at next visit.  If clinically healing we will work on strengthening at that point.   Follow-Up Instructions: Return in about 3 weeks (around 10/29/2017).     Procedures: None today.   PMFS History: Patient Active Problem List   Diagnosis Date Noted  . Sinusitis, acute maxillary 11/23/2015  . Ear pain 09/01/2014  . Insomnia  09/01/2014  . Rheumatoid arthritis ([place]) 01/12/2013  . Celiac sprue 10/23/2010  . ELEVATED BLOOD PRESSURE WITHOUT DIAGNOSIS OF HYPERTENSION 08/08/2009  . ALLERGIC RHINITIS 01/19/2009   Past Medical History:  Diagnosis Date  . Arthritis   . Celiac disease 2008  . Chronic headaches   . GERD (gastroesophageal reflux disease)   . Melanoma ([place])    2005 surgery     Family History  Problem Relation Age of Onset  . Diabetes Mother   . Hypertension Mother   . Diabetes Father   . Coronary artery disease Maternal Grandmother   . Coronary artery disease Paternal Grandmother   . Stomach cancer Paternal Grandfather     Past Surgical History:  Procedure Laterality Date  . BREAST REDUCTION SURGERY    . CESAREAN SECTION     93 96  . LIPOSUCTION    . TUBAL LIGATION     Social History   Occupational History  . Occupation: Leisure centre manager    Comment: tennis soccer  Tobacco Use  . Smoking status: Never Smoker  . Smokeless tobacco: Never Used  Substance and Sexual Activity  . Alcohol use: No  . Drug use: No  . Sexual activity: Yes    Partners: Male    Birth control/protection: Surgical

## 2017-10-09 DIAGNOSIS — Z780 Asymptomatic menopausal state: Secondary | ICD-10-CM | POA: Diagnosis not present

## 2017-10-09 DIAGNOSIS — L4050 Arthropathic psoriasis, unspecified: Secondary | ICD-10-CM | POA: Diagnosis not present

## 2017-10-09 DIAGNOSIS — M25539 Pain in unspecified wrist: Secondary | ICD-10-CM | POA: Diagnosis not present

## 2017-10-09 DIAGNOSIS — M0589 Other rheumatoid arthritis with rheumatoid factor of multiple sites: Secondary | ICD-10-CM | POA: Diagnosis not present

## 2017-10-29 ENCOUNTER — Ambulatory Visit (INDEPENDENT_AMBULATORY_CARE_PROVIDER_SITE_OTHER): Payer: Self-pay

## 2017-10-29 ENCOUNTER — Ambulatory Visit (INDEPENDENT_AMBULATORY_CARE_PROVIDER_SITE_OTHER): Payer: BLUE CROSS/BLUE SHIELD | Admitting: Family Medicine

## 2017-10-29 ENCOUNTER — Encounter (INDEPENDENT_AMBULATORY_CARE_PROVIDER_SITE_OTHER): Payer: Self-pay | Admitting: Family Medicine

## 2017-10-29 DIAGNOSIS — M069 Rheumatoid arthritis, unspecified: Secondary | ICD-10-CM | POA: Diagnosis not present

## 2017-10-29 DIAGNOSIS — M0589 Other rheumatoid arthritis with rheumatoid factor of multiple sites: Secondary | ICD-10-CM | POA: Diagnosis not present

## 2017-10-29 DIAGNOSIS — S42432D Displaced fracture (avulsion) of lateral epicondyle of left humerus, subsequent encounter for fracture with routine healing: Secondary | ICD-10-CM | POA: Diagnosis not present

## 2017-10-29 DIAGNOSIS — E785 Hyperlipidemia, unspecified: Secondary | ICD-10-CM | POA: Diagnosis not present

## 2017-10-29 NOTE — Addendum Note (Signed)
Addended by: Hortencia Pilar on: 10/29/2017 10:04 AM   Modules accepted: Level of Service

## 2017-10-29 NOTE — Progress Notes (Signed)
   Office Visit Note   Patient: Natalie Brady           Date of Birth: [birth year]           MRN: [id number] Visit Date: 10/29/2017 Requested by: Jani Gravel, MD [address], [place] [address] PCP: Jani Gravel, MD  Subjective: Chief Complaint  Patient presents with  . Left Elbow - Follow-up    HPI: She is about 3 weeks status post left elbow lateral epicondyle humerus avulsion fracture.  Doing very well, pain steadily improving.  She has almost regained full range of motion.  Doing light duty work.              ROS: Noncontributory  Objective: Vital Signs: LMP 01/09/2015 Comment: light  Physical Exam:  Left elbow: No swelling or bruising, full active range of motion compared to the right.  Still slightly tender at the lateral epicondyle.  Mild pain with pronation/supination against resistance, no pain with wrist extension.  Imaging: 2 view left elbow x-rays:  Stable avulsion fracture position, possibly some early callous formation.  Assessment & Plan: 1.  Clinically healing 3 weeks status post left elbow lateral epicondyle humerus avulsion fracture -Gentle strengthening exercises for the next 2 to 3 weeks.  Once pain-free, resume regular duty work. -Possible x-rays in 3 weeks if still having symptoms.   Follow-Up Instructions: No follow-ups on file.       Procedures: None today.   PMFS History: Patient Active Problem List   Diagnosis Date Noted  . Sinusitis, acute maxillary 11/23/2015  . Ear pain 09/01/2014  . Insomnia 09/01/2014  . Rheumatoid arthritis ([facility]) 01/12/2013  . Celiac sprue 10/23/2010  . ELEVATED BLOOD PRESSURE WITHOUT DIAGNOSIS OF HYPERTENSION 08/08/2009  . ALLERGIC RHINITIS 01/19/2009   Past Medical History:  Diagnosis Date  . Arthritis   . Celiac disease 2008  . Chronic headaches   . GERD (gastroesophageal reflux disease)   . Melanoma ([facility])    2005 surgery     Family History  Problem Relation Age of Onset  . Diabetes  Mother   . Hypertension Mother   . Diabetes Father   . Coronary artery disease Maternal Grandmother   . Coronary artery disease Paternal Grandmother   . Stomach cancer Paternal Grandfather     Past Surgical History:  Procedure Laterality Date  . BREAST REDUCTION SURGERY    . CESAREAN SECTION     93 96  . LIPOSUCTION    . TUBAL LIGATION     Social History   Occupational History  . Occupation: Leisure centre manager    Comment: tennis soccer  Tobacco Use  . Smoking status: Never Smoker  . Smokeless tobacco: Never Used  Substance and Sexual Activity  . Alcohol use: No  . Drug use: No  . Sexual activity: Yes    Partners: Male    Birth control/protection: Surgical

## 2017-11-19 ENCOUNTER — Ambulatory Visit (INDEPENDENT_AMBULATORY_CARE_PROVIDER_SITE_OTHER): Payer: BLUE CROSS/BLUE SHIELD | Admitting: Family Medicine

## 2017-12-10 ENCOUNTER — Ambulatory Visit (INDEPENDENT_AMBULATORY_CARE_PROVIDER_SITE_OTHER): Payer: BLUE CROSS/BLUE SHIELD | Admitting: Family Medicine

## 2017-12-10 ENCOUNTER — Encounter (INDEPENDENT_AMBULATORY_CARE_PROVIDER_SITE_OTHER): Payer: Self-pay | Admitting: Family Medicine

## 2017-12-10 ENCOUNTER — Ambulatory Visit (INDEPENDENT_AMBULATORY_CARE_PROVIDER_SITE_OTHER): Payer: Self-pay

## 2017-12-10 DIAGNOSIS — S42432D Displaced fracture (avulsion) of lateral epicondyle of left humerus, subsequent encounter for fracture with routine healing: Secondary | ICD-10-CM

## 2017-12-10 NOTE — Progress Notes (Signed)
   Office Visit Note   Patient: Natalie Brady           Date of Birth: 1970-07-19           MRN: [id number] Visit Date: 12/10/2017 Requested by: Jani Gravel, MD [address], [place] [address] PCP: Jani Gravel, MD  Subjective: Chief Complaint  Patient presents with  . Left Elbow - Follow-up    Only occasional pain.  ROM good.  Strength is getting better.  Can pick up a gallon of milk without pain.    HPI: She is about 2-1/46-month status post left elbow lateral epicondyle avulsion fracture with partial tear, and extensor tendon.  Overall doing better, working a desk type right now.  She thinks she is ready to advance her activities.              ROS: Noncontributory  Objective: Vital Signs: LMP 01/09/2015 Comment: light  Physical Exam:  Left elbow: Full flexion and extension, full pronation and supination of the forearm.  Still slightly tender over the lateral epicondyle.  She has some pain with wrist extension against resistance.  Imaging: Elbow x-rays:  Avulsion fracture barely visible, steadily healing.  Assessment & Plan: 1.  Healing 2-1/2 months s/p left elbow avulsion fracture and partial tear common extensor tendon. [employer] duty for 3 weeks, then regular duty if tolerated.  RTC prn.   Follow-Up Instructions: Return in about 4 weeks (around 01/07/2018), or if symptoms worsen or fail to improve.      Procedures: No procedures performed  No notes on file    PMFS History: Patient Active Problem List   Diagnosis Date Noted  . Sinusitis, acute maxillary 11/23/2015  . Ear pain 09/01/2014  . Insomnia 09/01/2014  . Rheumatoid arthritis ([facility]) 01/12/2013  . Celiac sprue 10/23/2010  . ELEVATED BLOOD PRESSURE WITHOUT DIAGNOSIS OF HYPERTENSION 08/08/2009  . ALLERGIC RHINITIS 01/19/2009   Past Medical History:  Diagnosis Date  . Arthritis   . Celiac disease 2008  . Chronic headaches   . GERD (gastroesophageal reflux disease)   . Melanoma ([facility])      2005 surgery     Family History  Problem Relation Age of Onset  . Diabetes Mother   . Hypertension Mother   . Diabetes Father   . Coronary artery disease Maternal Grandmother   . Coronary artery disease Paternal Grandmother   . Stomach cancer Paternal Grandfather     Past Surgical History:  Procedure Laterality Date  . BREAST REDUCTION SURGERY    . CESAREAN SECTION     93 96  . LIPOSUCTION    . TUBAL LIGATION     Social History   Occupational History  . Occupation: Leisure centre manager    Comment: tennis soccer  Tobacco Use  . Smoking status: Never Smoker  . Smokeless tobacco: Never Used  Substance and Sexual Activity  . Alcohol use: No  . Drug use: No  . Sexual activity: Yes    Partners: Male    Birth control/protection: Surgical

## 2018-04-28 DIAGNOSIS — M0589 Other rheumatoid arthritis with rheumatoid factor of multiple sites: Secondary | ICD-10-CM | POA: Diagnosis not present

## 2018-04-28 DIAGNOSIS — Z79899 Other long term (current) drug therapy: Secondary | ICD-10-CM | POA: Diagnosis not present

## 2018-04-28 DIAGNOSIS — L4050 Arthropathic psoriasis, unspecified: Secondary | ICD-10-CM | POA: Diagnosis not present

## 2018-04-28 DIAGNOSIS — M25539 Pain in unspecified wrist: Secondary | ICD-10-CM | POA: Diagnosis not present

## 2019-07-11 IMAGING — DX DG FINGER MIDDLE 2+V*R*
3 series · 3 of 3 positions shown · non-contrast
Comparison: None.

CLINICAL DATA: Initial evaluation for acute pain and swelling ab
right middle finger status post fall.

EXAM:
RIGHT MIDDLE FINGER 2+V

[finger ap]
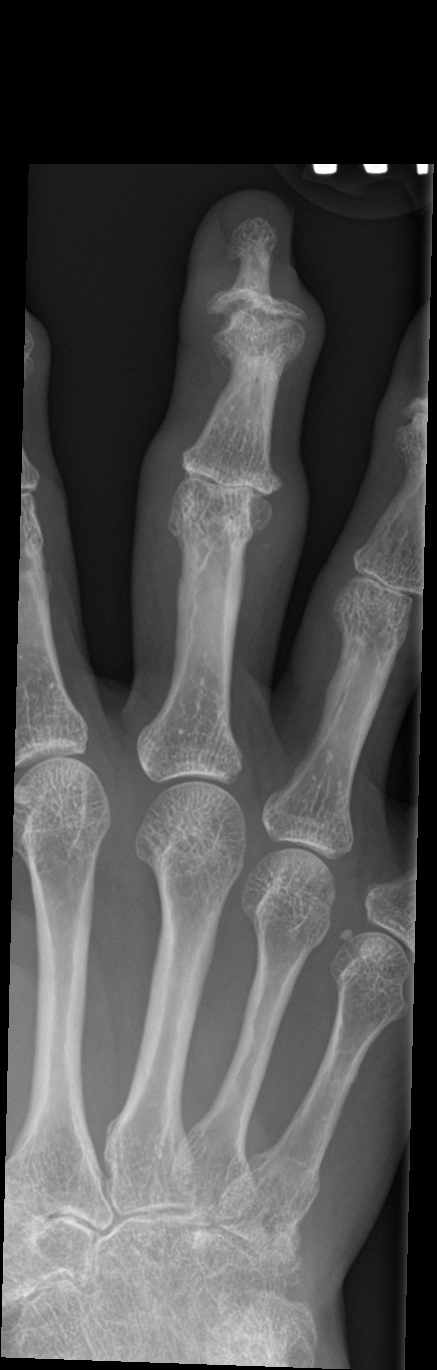

[finger obl]
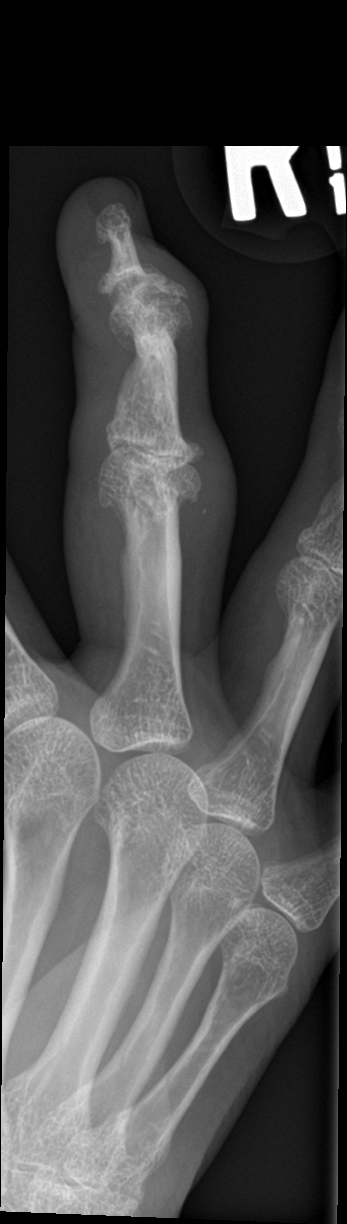

[finger lat]
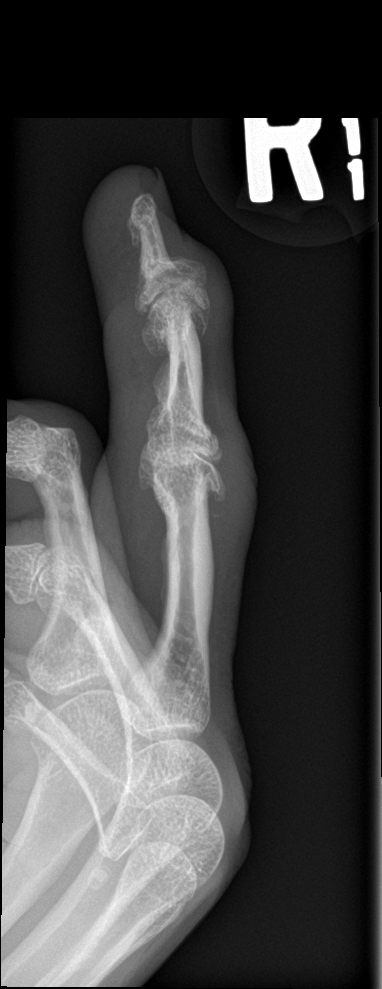

[3 of 3 positions shown; findings below may reference images not displayed]

FINDINGS: No acute fracture or dislocation. Advanced degenerative
osteoarthrosis present at the right third PIP and DIP joints with
associated soft tissue swelling. Osseous mineralization normal. No
other appreciable soft tissue injury.
IMPRESSION: 1. No acute fracture or dislocation.
2. Advanced degenerative osteoarthrosis at the right third DIP and
PIP joints with associated soft tissue swelling.

## 2019-11-16 ENCOUNTER — Other Ambulatory Visit: Payer: Self-pay | Admitting: Family Medicine

## 2019-11-16 DIAGNOSIS — Z1231 Encounter for screening mammogram for malignant neoplasm of breast: Secondary | ICD-10-CM

## 2019-12-16 ENCOUNTER — Ambulatory Visit: Payer: PRIVATE HEALTH INSURANCE

## 2020-02-22 ENCOUNTER — Ambulatory Visit: Payer: Self-pay

## 2020-02-22 ENCOUNTER — Other Ambulatory Visit: Payer: Self-pay

## 2020-02-22 ENCOUNTER — Other Ambulatory Visit: Payer: Self-pay | Admitting: Occupational Medicine

## 2020-02-22 DIAGNOSIS — M533 Sacrococcygeal disorders, not elsewhere classified: Secondary | ICD-10-CM

## 2020-10-16 DIAGNOSIS — M25551 Pain in right hip: Secondary | ICD-10-CM | POA: Diagnosis not present

## 2020-10-16 DIAGNOSIS — M25521 Pain in right elbow: Secondary | ICD-10-CM | POA: Diagnosis not present

## 2020-10-16 DIAGNOSIS — M542 Cervicalgia: Secondary | ICD-10-CM | POA: Diagnosis not present

## 2020-10-16 DIAGNOSIS — M25511 Pain in right shoulder: Secondary | ICD-10-CM | POA: Diagnosis not present

## 2020-11-06 DIAGNOSIS — Z79899 Other long term (current) drug therapy: Secondary | ICD-10-CM | POA: Diagnosis not present

## 2020-12-20 DIAGNOSIS — L4050 Arthropathic psoriasis, unspecified: Secondary | ICD-10-CM | POA: Diagnosis not present

## 2020-12-20 DIAGNOSIS — Z79899 Other long term (current) drug therapy: Secondary | ICD-10-CM | POA: Diagnosis not present

## 2020-12-20 DIAGNOSIS — M0589 Other rheumatoid arthritis with rheumatoid factor of multiple sites: Secondary | ICD-10-CM | POA: Diagnosis not present

## 2020-12-20 DIAGNOSIS — M1990 Unspecified osteoarthritis, unspecified site: Secondary | ICD-10-CM | POA: Diagnosis not present

## 2021-01-17 DIAGNOSIS — E119 Type 2 diabetes mellitus without complications: Secondary | ICD-10-CM | POA: Diagnosis not present

## 2021-01-25 DIAGNOSIS — Z0000 Encounter for general adult medical examination without abnormal findings: Secondary | ICD-10-CM | POA: Diagnosis not present

## 2021-01-25 DIAGNOSIS — Z1151 Encounter for screening for human papillomavirus (HPV): Secondary | ICD-10-CM | POA: Diagnosis not present

## 2021-01-25 DIAGNOSIS — Z124 Encounter for screening for malignant neoplasm of cervix: Secondary | ICD-10-CM | POA: Diagnosis not present

## 2021-02-08 DIAGNOSIS — E785 Hyperlipidemia, unspecified: Secondary | ICD-10-CM | POA: Diagnosis not present

## 2021-02-08 DIAGNOSIS — Z6827 Body mass index (BMI) 27.0-27.9, adult: Secondary | ICD-10-CM | POA: Diagnosis not present

## 2021-02-08 DIAGNOSIS — D802 Selective deficiency of immunoglobulin A [IgA]: Secondary | ICD-10-CM | POA: Diagnosis not present

## 2021-02-08 DIAGNOSIS — Z79899 Other long term (current) drug therapy: Secondary | ICD-10-CM | POA: Diagnosis not present

## 2021-02-08 DIAGNOSIS — E119 Type 2 diabetes mellitus without complications: Secondary | ICD-10-CM | POA: Diagnosis not present

## 2021-02-12 ENCOUNTER — Ambulatory Visit (HOSPITAL_COMMUNITY)
Admission: EM | Admit: 2021-02-12 | Discharge: 2021-02-12 | Disposition: A | Discharge status: Home / Self Care | Payer: BC Managed Care – PPO

## 2021-02-12 ENCOUNTER — Encounter (HOSPITAL_COMMUNITY): Payer: Self-pay | Admitting: Emergency Medicine

## 2021-02-12 ENCOUNTER — Other Ambulatory Visit: Payer: Self-pay

## 2021-02-12 DIAGNOSIS — B9689 Other specified bacterial agents as the cause of diseases classified elsewhere: Secondary | ICD-10-CM

## 2021-02-12 DIAGNOSIS — J329 Chronic sinusitis, unspecified: Secondary | ICD-10-CM

## 2021-02-12 DIAGNOSIS — J069 Acute upper respiratory infection, unspecified: Secondary | ICD-10-CM

## 2021-02-12 HISTORY — DX: Type 2 diabetes mellitus without complications: E11.9

## 2021-02-12 LAB — POC INFLUENZA A AND B ANTIGEN (URGENT CARE ONLY)
INFLUENZA A ANTIGEN, POC: NEGATIVE
INFLUENZA B ANTIGEN, POC: NEGATIVE

## 2021-02-12 MED ORDER — IBUPROFEN 400 MG PO TABS: 400.0000 mg | ORAL_TABLET | Freq: Three times a day (TID) | ORAL | 0 refills | Status: AC | PRN

## 2021-02-12 MED ORDER — FLUTICASONE PROPIONATE 50 MCG/ACT NA SUSP: 2.0000 | Freq: Every day | NASAL | 0 refills | Status: AC

## 2021-02-12 MED ORDER — ACETAMINOPHEN 325 MG PO TABS
975.0000 mg | ORAL_TABLET | Freq: Once | ORAL | Status: AC
  Administered 2021-02-12: 975 mg via ORAL

## 2021-02-12 MED ORDER — FLUCONAZOLE 150 MG PO TABS: ORAL_TABLET | ORAL | 0 refills | Status: AC

## 2021-02-12 MED ORDER — PROMETHAZINE-DM 6.25-15 MG/5ML PO SYRP: 5.0000 mL | ORAL_SOLUTION | Freq: Four times a day (QID) | ORAL | 0 refills | Status: AC | PRN

## 2021-02-12 MED ORDER — ACETAMINOPHEN 500 MG PO TABS: 1000.0000 mg | ORAL_TABLET | Freq: Three times a day (TID) | ORAL | 0 refills | Status: AC | PRN

## 2021-02-12 MED ORDER — AMOXICILLIN-POT CLAVULANATE 875-125 MG PO TABS: 1.0000 | ORAL_TABLET | Freq: Two times a day (BID) | ORAL | 0 refills | Status: AC

## 2021-02-12 MED ORDER — ACETAMINOPHEN 325 MG PO TABS
ORAL_TABLET | ORAL | Status: AC
  Filled 2021-02-12: qty 3

## 2021-02-12 MED ORDER — ACETAMINOPHEN 325 MG PO TABS: 650.0000 mg | ORAL_TABLET | Freq: Once | ORAL | Status: DC

## 2021-02-12 NOTE — ED Provider Notes (Signed)
[facility]    CSN: [id number] Arrival date & time: 02/12/21  1111    HISTORY   Chief Complaint  Patient presents with   Cough   HPI Natalie Brady is a 51 y.o. female. Patient presents today with a temperature of 1-2.1 and a heart rate of 126.  Patient states her symptoms began four days ago on February 08, 2021.  Symptoms include chills, fever, T-max 103, cough, throat congestion, sinus drainage which is green and yellow.  Patient states home COVID test was negative.  Patient was provided with Tylenol 975 after triage.  Influenza test today is negative.  Patient states she has tried NyQuil, ibuprofen and Advil allergy, all provide temporary relief.  The history is provided by the patient.  Past Medical History:  Diagnosis Date   Arthritis    Celiac disease 02/04/2006   Chronic headaches    Diabetes mellitus without complication (HCC)    GERD (gastroesophageal reflux disease)    Melanoma ([facility])    2005 surgery    Patient Active Problem List   Diagnosis Date Noted   Sinusitis, acute maxillary 11/23/2015   Ear pain 09/01/2014   Insomnia 09/01/2014   Rheumatoid arthritis ([facility]) 01/12/2013   Celiac sprue 10/23/2010   ELEVATED BLOOD PRESSURE WITHOUT DIAGNOSIS OF HYPERTENSION 08/08/2009   ALLERGIC RHINITIS 01/19/2009   Past Surgical History:  Procedure Laterality Date   BREAST REDUCTION SURGERY     CESAREAN SECTION     93 96   LIPOSUCTION     TUBAL LIGATION     OB History     Gravida  2   Para  2   Term  2   Preterm      AB      Living  2      SAB      IAB      Ectopic      Multiple      Live Births  2          Home Medications    Prior to Admission medications   Medication Sig Start Date End Date Taking? Authorizing Provider  azithromycin (ZITHROMAX) 250 MG tablet Take 1 tablet by mouth daily. 03/29/17   [provider]  gabapentin (NEURONTIN) 100 MG capsule Take by mouth. 09/26/17   [provider]  Multiple  Vitamin (MULTIVITAMIN) tablet Take by mouth.    [provider]  Tofacitinib Citrate (XELJANZ XR) 11 MG TB24 Take 11 mg by mouth daily.    [provider]   Family History Family History  Problem Relation Age of Onset   Thyroid disease Mother    Diabetes Mother    Hypertension Mother    Stroke Father    Diabetes Father    Coronary artery disease Maternal Grandmother    Coronary artery disease Paternal Grandmother    Stomach cancer Paternal Grandfather    Social History Social History   Tobacco Use   Smoking status: Never   Smokeless tobacco: Never  Vaping Use   Vaping Use: Never used  Substance Use Topics   Alcohol use: No   Drug use: No   Allergies   Bee venom, Coconut oil, Other, and Mucinex [guaifenesin er]  Review of Systems Review of Systems Pertinent findings noted in history of present illness.   Physical Exam Triage Vital Signs ED Triage Vitals  Enc Vitals Group     BP 12/01/20 0827 (!) 147/82     Pulse Rate 12/01/20 0827 72  Resp 12/01/20 0827 18     Temp 12/01/20 0827 98.3 F (36.8 C)     Temp Source 12/01/20 0827 Oral     SpO2 12/01/20 0827 98 %     Weight --      Height --      Head Circumference --      Peak Flow --      Pain Score 12/01/20 0826 5     Pain Loc --      Pain Edu? --      Excl. in [place]? --   No data found.  Updated Vital Signs BP 120/78 (BP Location: Right Arm)    Pulse (!) 126    Temp (!) 102.1 F (38.9 C) (Oral)    Resp 20    LMP 01/09/2015 Comment: light   SpO2 95%   Physical Exam Constitutional:      Appearance: She is ill-appearing.  HENT:     Head: Normocephalic and atraumatic.     Salivary Glands: Right salivary gland is not diffusely enlarged or tender. Left salivary gland is not diffusely enlarged or tender.     Right Ear: Tympanic membrane, ear canal and external ear normal.     Left Ear: Tympanic membrane, ear canal and external ear normal.     Nose: Congestion and rhinorrhea present.  Rhinorrhea is clear.     Right Sinus: No maxillary sinus tenderness or frontal sinus tenderness.     Left Sinus: No maxillary sinus tenderness.     Mouth/Throat:     Mouth: Mucous membranes are moist.     Pharynx: Pharyngeal swelling, posterior oropharyngeal erythema and uvula swelling present.     Tonsils: No tonsillar exudate. 0 on the right. 0 on the left.  Cardiovascular:     Rate and Rhythm: Normal rate and regular rhythm.     Pulses: Normal pulses.  Pulmonary:     Effort: Pulmonary effort is normal. No accessory muscle usage, prolonged expiration or respiratory distress.     Breath sounds: No stridor. No wheezing, rhonchi or rales.     Comments: Turbulent breath sounds throughout without wheeze, rale, rhonchi. Abdominal:     General: Abdomen is flat. Bowel sounds are normal.     Palpations: Abdomen is soft.  Musculoskeletal:        General: Normal range of motion.  Lymphadenopathy:     Cervical: Cervical adenopathy present.     Right cervical: Superficial cervical adenopathy and posterior cervical adenopathy present.     Left cervical: Superficial cervical adenopathy and posterior cervical adenopathy present.  Skin:    General: Skin is warm and dry.  Neurological:     General: No focal deficit present.     Mental Status: She is alert and oriented to person, place, and time.     Motor: Motor function is intact.     Coordination: Coordination is intact.     Gait: Gait is intact.     Deep Tendon Reflexes: Reflexes are normal and symmetric.  Psychiatric:        Attention and Perception: Attention and perception normal.        Mood and Affect: Mood and affect normal.        Speech: Speech normal.        Behavior: Behavior normal. Behavior is cooperative.        Thought Content: Thought content normal.    Visual Acuity Right Eye Distance:   Left Eye Distance:   Bilateral Distance:  Right Eye Near:   Left Eye Near:    Bilateral Near:     UC Couse / Diagnostics /  Procedures:    EKG  Radiology No results found.  Procedures Procedures (including critical care time)  UC Diagnoses / Final Clinical Impressions(s)   I have reviewed the triage vital signs and the nursing notes.  Pertinent labs & imaging results that were available during my care of the patient were reviewed by me and considered in my medical decision making (see chart for details).   Final diagnoses:  Viral URI with cough  Upper respiratory tract infection, unspecified type  Bacterial sinusitis   Influenza-like illness with likely bacterial sinus infection secondary to viral upper respiratory infection.  Begin Augmentin, patient provided with Diflucan at her request.  Conservative care recommended.  ED Prescriptions     Medication Sig Dispense Auth. Provider   amoxicillin-clavulanate (AUGMENTIN) 875-125 MG tablet Take 1 tablet by mouth every 12 (twelve) hours for 7 days. 14 tablet Lynden Oxford Scales, PA-C   fluconazole (DIFLUCAN) 150 MG tablet Take 1 tablet on day 4 of antibiotics.  Take second tablet 3 days later. 2 tablet Lynden Oxford Scales, PA-C   promethazine-dextromethorphan (PROMETHAZINE-DM) 6.25-15 MG/5ML syrup Take 5 mLs by mouth 4 (four) times daily as needed for cough. 180 mL Lynden Oxford Scales, PA-C   fluticasone (FLONASE) 50 MCG/ACT nasal spray Place 2 sprays into both nostrils daily. 18 mL Lynden Oxford Scales, PA-C   ibuprofen (ADVIL) 400 MG tablet Take 1 tablet (400 mg total) by mouth every 8 (eight) hours as needed for up to 30 doses. 30 tablet Lynden Oxford Scales, PA-C   acetaminophen (TYLENOL) 500 MG tablet Take 2 tablets (1,000 mg total) by mouth every 8 (eight) hours as needed for up to 30 doses for mild pain or fever. 60 tablet Lynden Oxford Scales, PA-C      PDMP not reviewed this encounter.  Pending results:  Labs Reviewed  POC INFLUENZA A AND B ANTIGEN (URGENT CARE ONLY)    Medications Ordered in UC: Medications  acetaminophen  (TYLENOL) tablet 975 mg (975 mg Oral Given 02/12/21 1243)    Disposition Upon Discharge:  Condition: stable for discharge home Home: take medications as prescribed; routine discharge instructions as discussed; follow up as advised.  Patient presented with an acute illness with associated systemic symptoms and significant discomfort requiring urgent management. In my opinion, this is a condition that a prudent lay person (someone who possesses an average knowledge of health and medicine) may potentially expect to result in complications if not addressed urgently such as respiratory distress, impairment of bodily function or dysfunction of bodily organs.   Routine symptom specific, illness specific and/or disease specific instructions were discussed with the patient and/or caregiver at length.   As such, the patient has been evaluated and assessed, work-up was performed and treatment was provided in alignment with urgent care protocols and evidence based medicine.  Patient/parent/caregiver has been advised that the patient may require follow up for further testing and treatment if the symptoms continue in spite of treatment, as clinically indicated and appropriate.  If the patient was tested for COVID-19, Influenza and/or RSV, then the patient/parent/guardian was advised to isolate at home pending the results of his/her diagnostic coronavirus test and potentially longer if theyre positive. I have also advised pt that if his/her COVID-19 test returns positive, it's recommended to self-isolate for at least 10 days after symptoms first appeared AND until fever-free for 24 hours without fever  reducer AND other symptoms have improved or resolved. Discussed self-isolation recommendations as well as instructions for household member/close contacts as per the [facility] and [place] DHHS, and also gave patient the [facility] packet with this information.  Patient/parent/caregiver has been advised to return to the [facility] or PCP in  3-5 days if no better; to PCP or the Emergency Department if new signs and symptoms develop, or if the current signs or symptoms continue to change or worsen for further workup, evaluation and treatment as clinically indicated and appropriate  The patient will follow up with their current PCP if and as advised. If the patient does not currently have a PCP we will assist them in obtaining one.   The patient may need specialty follow up if the symptoms continue, in spite of conservative treatment and management, for further workup, evaluation, consultation and treatment as clinically indicated and appropriate.  Patient/parent/caregiver verbalized understanding and agreement of plan as discussed.  All questions were addressed during visit.  Please see discharge instructions below for further details of plan.  Discharge Instructions:   Discharge Instructions      Your symptoms and physical exam findings are concerning for a viral respiratory infection.  Unfortunately, your influenza test in the office today was negative.  Also unfortunately, given the duration of your symptoms, antiviral treatment would be of no benefit at this time.   Based on my physical exam findings and the history you have provided  today, I believe you would benefit from antibiotics at this time.  I provided you with a 7-day course of Augmentin along with a prescription for Diflucan for the inevitable vaginal yeast infection caused by antibiotics.       Conservative care is also recommended at this time.  This includes rest, pushing clear fluids and activity as tolerated.  Warm beverages such as teas and broths versus cold beverages/popsicles and frozen sherbet/sorbet are personal choice, both warm and cold are beneficial.  You may also notice that your appetite is reduced; this is okay as long as you are drinking plenty of clear fluids.    Please see the list below for recommended medications, dosages and frequencies to  provide relief of your current symptoms:     Fluticasone (Flonase): This is a steroid nasal spray that you use once daily, 1 spray in each nare.  After 3 to 5 days of use, you will have significant improvement of the inflammation and mucus production that is being caused by exposure to allergens.  This medication can be purchased over-the-counter however I provided you with a prescription.      Ibuprofen  (Advil, Motrin): This is a good anti-inflammatory medication which addresses aches and pains and inflammation of the upper airways that causes sinus and nasal congestion as well as in the lower airways which makes your cough feel tight and sometimes burn.  I recommend that you take between 400 to 600 mg every 6-8 hours as needed.      Acetaminophen (Tylenol): This is a good fever reducer.  If there body temperature rises above 101.5 as measured with a thermometer, it is recommended that you give them 1,000 mg every 6-8 hours until they are temperature falls below 101.5, please not take more than 3,000 mg of acetaminophen either as a separate medication or as in ingredient in an over-the-counter cold/flu preparation within a 24-hour period.      Promethazine DM: Promethazine is both the nasal decongestant and an antinausea medication that makes most patients feel  fairly sleepy.  The DM is dextromethorphan, a cough suppressant found many over-the-counter cough medications.  Please take 5 mL before bedtime to help you sleep better, minimize your cough.  I have provided you with a prescription for this medication.      Please remain home from work, school, public places until you have been fever free for 24 hours without the use of antifever medications such as Tylenol or ibuprofen.    Please follow-up with either your primary care provider or with urgent care for repeat evaluation you of your lungs in the next 3 to 4 days to ensure that you are improving and also to evaluate whether or not your treatment  regimen needs to be adjusted.         This office note has been dictated using Museum/gallery curator.  Unfortunately, and despite my best efforts, this method of dictation can sometimes lead to occasional typographical or grammatical errors.  I apologize in advance if this occurs.      Lynden Oxford Scales, PA-C 02/12/21 1325

## 2021-02-12 NOTE — ED Triage Notes (Signed)
Started on 1/502023.  Symptoms have included chills, fever 103, cough, throat congestion/drainage and sinus drainage.  Sinus drainage is green and yellow.  Home covid tests have been negative

## 2021-02-12 NOTE — Discharge Instructions (Addendum)
Your symptoms and physical exam findings are concerning for a viral respiratory infection.  Unfortunately, your influenza test in the office today was negative.  Also unfortunately, given the duration of your symptoms, antiviral treatment would be of no benefit at this time.   Based on my physical exam findings and the history you have provided  today, I believe you would benefit from antibiotics at this time.  I provided you with a 7-day course of Augmentin along with a prescription for Diflucan for the inevitable vaginal yeast infection caused by antibiotics.       Conservative care is also recommended at this time.  This includes rest, pushing clear fluids and activity as tolerated.  Warm beverages such as teas and broths versus cold beverages/popsicles and frozen sherbet/sorbet are personal choice, both warm and cold are beneficial.  You may also notice that your appetite is reduced; this is okay as long as you are drinking plenty of clear fluids.    Please see the list below for recommended medications, dosages and frequencies to provide relief of your current symptoms:     Fluticasone (Flonase): This is a steroid nasal spray that you use once daily, 1 spray in each nare.  After 3 to 5 days of use, you will have significant improvement of the inflammation and mucus production that is being caused by exposure to allergens.  This medication can be purchased over-the-counter however I provided you with a prescription.      Ibuprofen  (Advil, Motrin): This is a good anti-inflammatory medication which addresses aches and pains and inflammation of the upper airways that causes sinus and nasal congestion as well as in the lower airways which makes your cough feel tight and sometimes burn.  I recommend that you take between 400 to 600 mg every 6-8 hours as needed.      Acetaminophen (Tylenol): This is a good fever reducer.  If there body temperature rises above 101.5 as measured with a thermometer, it is  recommended that you give them 1,000 mg every 6-8 hours until they are temperature falls below 101.5, please not take more than 3,000 mg of acetaminophen either as a separate medication or as in ingredient in an over-the-counter cold/flu preparation within a 24-hour period.      Promethazine DM: Promethazine is both the nasal decongestant and an antinausea medication that makes most patients feel fairly sleepy.  The DM is dextromethorphan, a cough suppressant found many over-the-counter cough medications.  Please take 5 mL before bedtime to help you sleep better, minimize your cough.  I have provided you with a prescription for this medication.      Please remain home from work, school, public places until you have been fever free for 24 hours without the use of antifever medications such as Tylenol or ibuprofen.    Please follow-up with either your primary care provider or with urgent care for repeat evaluation you of your lungs in the next 3 to 4 days to ensure that you are improving and also to evaluate whether or not your treatment regimen needs to be adjusted.

## 2021-02-27 DIAGNOSIS — Z1212 Encounter for screening for malignant neoplasm of rectum: Secondary | ICD-10-CM | POA: Diagnosis not present

## 2021-02-27 DIAGNOSIS — Z1211 Encounter for screening for malignant neoplasm of colon: Secondary | ICD-10-CM | POA: Diagnosis not present

## 2021-03-07 LAB — COLOGUARD: COLOGUARD: NEGATIVE

## 2021-05-03 DIAGNOSIS — L720 Epidermal cyst: Secondary | ICD-10-CM | POA: Diagnosis not present

## 2021-06-06 DIAGNOSIS — Z87442 Personal history of urinary calculi: Secondary | ICD-10-CM | POA: Diagnosis not present

## 2021-06-06 DIAGNOSIS — N200 Calculus of kidney: Secondary | ICD-10-CM | POA: Diagnosis not present

## 2021-06-20 DIAGNOSIS — Z79899 Other long term (current) drug therapy: Secondary | ICD-10-CM | POA: Diagnosis not present

## 2021-06-20 DIAGNOSIS — M0589 Other rheumatoid arthritis with rheumatoid factor of multiple sites: Secondary | ICD-10-CM | POA: Diagnosis not present

## 2021-06-20 DIAGNOSIS — L4050 Arthropathic psoriasis, unspecified: Secondary | ICD-10-CM | POA: Diagnosis not present

## 2021-06-20 DIAGNOSIS — M25461 Effusion, right knee: Secondary | ICD-10-CM | POA: Diagnosis not present

## 2021-08-13 DIAGNOSIS — E1165 Type 2 diabetes mellitus with hyperglycemia: Secondary | ICD-10-CM | POA: Diagnosis not present

## 2021-08-13 DIAGNOSIS — E785 Hyperlipidemia, unspecified: Secondary | ICD-10-CM | POA: Diagnosis not present

## 2021-08-13 DIAGNOSIS — E118 Type 2 diabetes mellitus with unspecified complications: Secondary | ICD-10-CM | POA: Diagnosis not present

## 2021-09-12 DIAGNOSIS — H40013 Open angle with borderline findings, low risk, bilateral: Secondary | ICD-10-CM | POA: Diagnosis not present

## 2021-09-12 DIAGNOSIS — H04123 Dry eye syndrome of bilateral lacrimal glands: Secondary | ICD-10-CM | POA: Diagnosis not present

## 2021-09-12 DIAGNOSIS — H25013 Cortical age-related cataract, bilateral: Secondary | ICD-10-CM | POA: Diagnosis not present

## 2021-09-12 DIAGNOSIS — E119 Type 2 diabetes mellitus without complications: Secondary | ICD-10-CM | POA: Diagnosis not present

## 2021-12-18 DIAGNOSIS — M0589 Other rheumatoid arthritis with rheumatoid factor of multiple sites: Secondary | ICD-10-CM | POA: Diagnosis not present

## 2021-12-18 DIAGNOSIS — Z79899 Other long term (current) drug therapy: Secondary | ICD-10-CM | POA: Diagnosis not present

## 2021-12-18 DIAGNOSIS — L4050 Arthropathic psoriasis, unspecified: Secondary | ICD-10-CM | POA: Diagnosis not present

## 2021-12-18 DIAGNOSIS — M25461 Effusion, right knee: Secondary | ICD-10-CM | POA: Diagnosis not present

## 2022-01-02 MED ORDER — RINVOQ 15 MG TABLET,EXTENDED RELEASE
ORAL_TABLET | 0 refills | 0 days
Start: 2022-01-02 — End: ?

## 2022-01-24 DIAGNOSIS — E118 Type 2 diabetes mellitus with unspecified complications: Secondary | ICD-10-CM | POA: Diagnosis not present

## 2022-01-24 DIAGNOSIS — R946 Abnormal results of thyroid function studies: Secondary | ICD-10-CM | POA: Diagnosis not present

## 2022-01-24 DIAGNOSIS — M8580 Other specified disorders of bone density and structure, unspecified site: Secondary | ICD-10-CM | POA: Diagnosis not present

## 2022-01-24 DIAGNOSIS — Z79899 Other long term (current) drug therapy: Secondary | ICD-10-CM | POA: Diagnosis not present

## 2022-01-24 DIAGNOSIS — E785 Hyperlipidemia, unspecified: Secondary | ICD-10-CM | POA: Diagnosis not present

## 2022-01-30 DIAGNOSIS — R1030 Lower abdominal pain, unspecified: Secondary | ICD-10-CM | POA: Diagnosis not present

## 2022-01-30 DIAGNOSIS — Z0000 Encounter for general adult medical examination without abnormal findings: Secondary | ICD-10-CM | POA: Diagnosis not present

## 2022-01-30 DIAGNOSIS — R059 Cough, unspecified: Secondary | ICD-10-CM | POA: Diagnosis not present

## 2022-02-13 DIAGNOSIS — E785 Hyperlipidemia, unspecified: Secondary | ICD-10-CM | POA: Diagnosis not present

## 2022-02-13 DIAGNOSIS — E1165 Type 2 diabetes mellitus with hyperglycemia: Secondary | ICD-10-CM | POA: Diagnosis not present

## 2022-03-07 ENCOUNTER — Other Ambulatory Visit: Payer: Self-pay

## 2022-03-07 ENCOUNTER — Ambulatory Visit (HOSPITAL_COMMUNITY)
Admission: RE | Admit: 2022-03-07 | Discharge: 2022-03-07 | Disposition: A | Discharge status: Home / Self Care | Payer: BC Managed Care – PPO | Source: Ambulatory Visit

## 2022-03-07 DIAGNOSIS — R519 Headache, unspecified: Secondary | ICD-10-CM | POA: Diagnosis not present

## 2022-03-07 DIAGNOSIS — R2689 Other abnormalities of gait and mobility: Secondary | ICD-10-CM

## 2022-03-07 DIAGNOSIS — H538 Other visual disturbances: Secondary | ICD-10-CM

## 2022-03-07 DIAGNOSIS — G4485 Primary stabbing headache: Secondary | ICD-10-CM

## 2022-03-07 DIAGNOSIS — G4452 New daily persistent headache (NDPH): Secondary | ICD-10-CM

## 2022-03-07 MED ORDER — GADOBUTROL 1 MMOL/ML IV SOLN
8.5000 mL | Freq: Once | INTRAVENOUS | Status: AC | PRN
  Administered 2022-03-07: 8.5 mL via INTRAVENOUS

## 2022-03-08 DIAGNOSIS — M069 Rheumatoid arthritis, unspecified: Secondary | ICD-10-CM | POA: Diagnosis not present

## 2022-03-08 DIAGNOSIS — R42 Dizziness and giddiness: Secondary | ICD-10-CM | POA: Diagnosis not present

## 2022-03-08 DIAGNOSIS — D84821 Immunodeficiency due to drugs: Secondary | ICD-10-CM | POA: Diagnosis not present

## 2022-03-08 DIAGNOSIS — Z1330 Encounter for screening examination for mental health and behavioral disorders, unspecified: Secondary | ICD-10-CM | POA: Diagnosis not present

## 2022-03-08 DIAGNOSIS — R519 Headache, unspecified: Secondary | ICD-10-CM | POA: Diagnosis not present

## 2022-03-08 DIAGNOSIS — M0579 Rheumatoid arthritis with rheumatoid factor of multiple sites without organ or systems involvement: Principal | ICD-10-CM

## 2022-03-08 NOTE — Unmapped (Signed)
[facility]   Patient Onboarding/Medication Counseling    Donna Levine is a 52 y.o. female with RA who I am counseling today on continuation of therapy.  I am speaking to the patient.    Was a Nurse, learning disability used for this call? No    Verified patient's date of birth / HIPAA.    Specialty medication(s) to be sent: Inflammatory Disorders: Rinvoq      Non-specialty medications/supplies to be sent: N/A      Medications not needed at this time: N/A       The patient declined counseling on medication administration, missed dose instructions, goals of therapy, side effects and monitoring parameters, warnings and precautions, drug/food interactions, and storage, handling precautions, and disposal because they have taken the medication previously. The information in the declined sections below are for informational purposes only and was not discussed with patient.     Patient has been taking for a while.  Doing well on medication. She is due for medication, received some samples from MD to help.      Rinvoq (upadacitinib)    Medication & Administration     Dosage: Rheumatoid arthritis: Take 15mg  by mouth one time daily    Lab tests required prior to treatment initiation: 2/2 tried to call office for labs but already closed.   Tuberculosis: Not documented in chart but patient confirms all pre-labs have been completed around early December 2023.   Hepatitis B: Not documented in chart but patient confirms all pre-labs have been completed around early December 2023.  Absolute lymphocyte count: Not documented in chart but patient confirms all pre-labs have been completed around early December 2023.  Absolute neutrophil count: Not documented in chart but patient confirms all pre-labs have been completed around early December 2023.  Hemoglobin: Not documented in chart but patient confirms all pre-labs have been completed around early December 2023.  Liver function tests: Not documented in chart but patient confirms all pre-labs have been completed around early December 2023.  Pregnancy: Not documented in chart but patient confirms not pregnant      Administration: Take tablets with or without food.  Swallow tablets whole; do not chew, break, or crush.    Adherence/Missed dose instructions:  Take a missed dose as soon as you think about it.  If it has been more than 8-12 hours since your normal dosing time, skip the missed dose and go back to your normal time the following day.  Never take 2 doses at the same time or extra doses in an attempt to 'catch up' after a missed dose.    Goals of Therapy     Achieve symptom remission  Slow disease progression  Protection of remaining articular structures  Maintenance of function  Maintenance of effective psychosocial functioning    Side Effects & Monitoring Parameters     Upset stomach  Signs of a common cold - minor sore throat, runny or stuffy nose, etc.    The following side effects should be reported to the provider:  Signs of a hypersensitivity reaction - rash; hives; itching; red, swollen, blistered, or peeling skin; wheezing; tightness in the chest or throat; difficulty breathing, swallowing, or talking; swelling of the mouth, face, lips, tongue, or throat; etc.  Reduced immune function - report signs of infection such as fever; chills; body aches; very bad sore throat; ear or sinus pain; cough; more sputum or change in color of sputum; pain with passing urine; wound that will not heal, etc.  Also at a slightly higher risk of some malignancies (mainly skin and blood cancers) due to this reduced immune function.  A new skin growth or lump  Stomach pain that is new or worse or change in bowel habits  Signs of a blood clot - chest pain or pressure; coughing up blood; shortness of breath; swelling, warmth, numbness, or pain in a leg or arm  Signs of MACE - stroke: trouble walking, speaking, and understanding, as well as paralysis or numbness of the face, arm, or leg; MI: tightness or pain in the chest, neck, back, or arms, as well as fatigue, lightheadedness, abnormal heartbeat, and anxiety      Contraindications, Warnings, & Precautions     Have your bloodwork checked as you have been told by your prescriber  Talk with your doctor if you are pregnant, planning to become pregnant, or breastfeeding - women taking this medication must use birth control while taking this drug and for some time after the last dose  Discuss the possible need for holding your dose(s) of Rinvoq?? when a planned procedure is scheduled with the prescriber as it may delay healing/recovery timeline       Drug/Food Interactions     Medication list reviewed in Epic. The patient was instructed to inform the care team before taking any new medications or supplements. No drug interactions identified.   Talk with you prescriber or pharmacist before receiving any live vaccinations while taking this medication and after you stop taking it    Storage, Handling Precautions, & Disposal     Store in the original container at room temperature and protect from moisture.      Current Medications (including OTC/herbals), Comorbidities and Allergies     Current Outpatient Medications   Medication Sig Dispense Refill    upadacitinib (RINVOQ) 15 mg Tb24 Take 1 tablet (15 mg) by mouth daily as directed. 30 tablet 0     No current facility-administered medications for this visit.       Not on File    There is no problem list on file for this patient.      Reviewed and up to date in Epic.    Appropriateness of Therapy     Acute infections noted within Epic:  No active infections  Patient reported infection: None    Is medication and dose appropriate based on diagnosis and infection status? Yes    Prescription has been clinically reviewed: Yes      Baseline Quality of Life Assessment      How many days over the past month did your RA  keep you from your normal activities? For example, brushing your teeth or getting up in the morning. 0    Financial Information     Medication Assistance provided: Prior Authorization and Copay Assistance    Anticipated copay of $5/30 reviewed with patient. Verified delivery address.    Delivery Information     Scheduled delivery date: 03/13/22    Expected start date: 03/13/22    Medication will be delivered via UPS to the prescription address in [facility].  This shipment will not require a signature.      Explained the services we provide at [facility] and that each month we would call to set up refills.  Stressed importance of returning phone calls so that we could ensure they receive their medications in time each month.  Informed patient that we should be setting up refills 7-10 days prior to when  they will run out of medication.  A pharmacist will reach out to perform a clinical assessment periodically.  Informed patient that a welcome packet, containing information about our pharmacy and other support services, a Notice of Privacy Practices, and a drug information handout will be sent.      The patient or caregiver noted above participated in the development of this care plan and knows that they can request review of or adjustments to the care plan at any time.      Patient or caregiver verbalized understanding of the above information as well as how to contact the pharmacy at [phone] option 4 with any questions/concerns.  The pharmacy is open Monday through Friday 8:30am-4:30pm.  A pharmacist is available 24/7 via pager to answer any clinical questions they may have.    Patient Specific Needs     Does the patient have any physical, cognitive, or cultural barriers? No    Does the patient have adequate living arrangements? (i.e. the ability to store and take their medication appropriately) Yes    Did you identify any home environmental safety or security hazards? No    Patient prefers to have medications discussed with  Patient     Is the patient or caregiver able to read and understand education materials at a high school level or above? Yes    Patient's primary language is  English     Is the patient high risk? No    SOCIAL DETERMINANTS OF HEALTH     At the [facility], we have learned that life circumstances - like trouble affording food, housing, utilities, or transportation can affect the health of many of our patients.   That is why we wanted to ask: are you currently experiencing any life circumstances that are negatively impacting your health and/or quality of life? Patient declined to answer    Social Determinants of Health     Financial Resource Strain: Not on file   Internet Connectivity: Not on file   Food Insecurity: Not on file   Tobacco Use: Not on file   Housing/Utilities: Not on file   Alcohol Use: Not on file   Transportation Needs: Not on file   Substance Use: Not on file   Health Literacy: Not on file   Physical Activity: Not on file   Interpersonal Safety: Not on file   Stress: Not on file   Intimate Partner Violence: Not on file   Depression: Not on file   Social Connections: Not on file       Would you be willing to receive help with any of the needs that you have identified today? Not applicable       [name], PharmD  [facility] Specialty Pharmacist

## 2022-03-08 NOTE — Unmapped (Signed)
[facility] Special[facility] Medication Onboarding    Specialty Medication: RINVOQ 15 mg Tb24 (upadacitinib)  Prior Authorization: Approved   Financial Assistance: Yes - copay card approved as secondary   Final Copay/Day Supply: $5 / 30 days     Insurance Restrictions: None     Notes to Pharmacist:     The triage team has completed the benefits investigation and has determined that the patient is able to fill this medication at [facility]. Please contact the patient to complete the onboarding or follow up with the prescribing physician as needed.

## 2022-03-12 MED FILL — RINVOQ 15 MG TABLET,EXTENDED RELEASE: ORAL | 30 days supply | Qty: 30 | Fill #0

## 2022-04-04 MED ORDER — RINVOQ 15 MG TABLET,EXTENDED RELEASE
ORAL_TABLET | Freq: Every day | ORAL | 0 refills | 0 days
Start: 2022-04-04 — End: ?

## 2022-04-04 MED ORDER — UPADACITINIB ER 15 MG TABLET,EXTENDED RELEASE 24 HR
ORAL_TABLET | Freq: Every day | ORAL | 0 refills | 30 days
Start: 2022-04-04 — End: ?

## 2022-04-04 NOTE — Unmapped (Signed)
[facility] Refill Coordination Note    Specialty Medication(s) to be Shipped:   Inflammatory Disorders: Rinvoq    Other medication(s) to be shipped: No additional medications requested for fill at this time     Donna Levine, DOB: [birth year]  Phone: [phone] (home)       All above HIPAA information was verified with patient.     Was a Nurse, learning disability used for this call? No    Completed refill call assessment today to schedule patient's medication shipment from the [facility] [phone]).  All relevant notes have been reviewed.     Specialty medication(s) and dose(s) confirmed: Regimen is correct and unchanged.   Changes to medications: Donna Levine reports no changes at this time.  Changes to insurance: No  New side effects reported not previously addressed with a pharmacist or physician: None reported  Questions for the pharmacist: No    Confirmed patient received a Conservation officer, historic buildings and a Surveyor, mining with first shipment. The patient will receive a drug information handout for each medication shipped and additional FDA Medication Guides as required.       DISEASE/MEDICATION-SPECIFIC INFORMATION        N/A    SPECIALTY MEDICATION ADHERENCE              Were doses missed due to medication being on hold? No    Rinvoq 15 mg: 10 days of medicine on hand       REFERRAL TO PHARMACIST     Referral to the pharmacist: Not needed      [facility]     Shipping address confirmed in Epic.     Patient was notified of new phone menu : Yes    Delivery Scheduled: Yes, Expected medication delivery date: 04/12/22.     Medication will be delivered via UPS to the prescription address in Epic WAM.    Donna Levine, PharmD   [facility] Specialty Pharmacist

## 2022-04-11 MED FILL — RINVOQ 15 MG TABLET,EXTENDED RELEASE: ORAL | 30 days supply | Qty: 30 | Fill #0

## 2022-05-07 MED ORDER — RINVOQ 15 MG TABLET,EXTENDED RELEASE
ORAL_TABLET | Freq: Every day | ORAL | 0 refills | 30 days
Start: 2022-05-07 — End: ?

## 2022-05-07 NOTE — Unmapped (Signed)
[facility] Refill Coordination Note    Specialty Medication(s) to be Shipped:   Inflammatory Disorders: Rinvoq    Other medication(s) to be shipped: No additional medications requested for fill at this time     Donna Levine, DOB: 12/05/1970  Phone: [phone] (home)       All above HIPAA information was verified with patient.     Was a Nurse, learning disability used for this call? No    Completed refill call assessment today to schedule patient's medication shipment from the [facility] [phone]).  All relevant notes have been reviewed.     Specialty medication(s) and dose(s) confirmed: Regimen is correct and unchanged.   Changes to medications: [name] reports no changes at this time.  Changes to insurance: No  New side effects reported not previously addressed with a pharmacist or physician: None reported  Questions for the pharmacist: No    Confirmed patient received a Conservation officer, historic buildings and a Surveyor, mining with first shipment. The patient will receive a drug information handout for each medication shipped and additional FDA Medication Guides as required.       DISEASE/MEDICATION-SPECIFIC INFORMATION        N/A    SPECIALTY MEDICATION ADHERENCE              Were doses missed due to medication being on hold? No    Rinvoq 15 mg: 7 days of medicine on hand       REFERRAL TO PHARMACIST     Referral to the pharmacist: Not needed      [facility]     Shipping address confirmed in Epic.     Patient was notified of new phone menu : Yes    Delivery Scheduled: Yes, Expected medication delivery date: 05/10/22.  However, Rx request for refills was sent to the provider as there are none remaining.     Medication will be delivered via UPS to the prescription address in Epic WAM.    [name], PharmD   [facility] Specialty Pharmacist

## 2022-05-08 MED ORDER — RINVOQ 15 MG TABLET,EXTENDED RELEASE
ORAL_TABLET | Freq: Every day | ORAL | 0 refills | 30 days
Start: 2022-05-08 — End: ?

## 2022-05-08 MED ORDER — UPADACITINIB ER 15 MG TABLET,EXTENDED RELEASE 24 HR
ORAL_TABLET | Freq: Every day | ORAL | 0 refills | 30 days
Start: 2022-05-08 — End: ?

## 2022-05-13 NOTE — Unmapped (Signed)
Donna Levine 's RINVOQ 15 mg Tb24 (upadacitinib) shipment will be sent out  as a result of a new prescription for the medication has been received.      I have reached out to the patient  at [phone] and communicated the delivery change. We will reschedule the medication for the delivery date that the patient agreed upon.  We have confirmed the delivery date as 05/15/22

## 2022-05-14 MED FILL — RINVOQ 15 MG TABLET,EXTENDED RELEASE: ORAL | 30 days supply | Qty: 30 | Fill #0

## 2022-05-31 MED ORDER — RINVOQ 15 MG TABLET,EXTENDED RELEASE
ORAL_TABLET | Freq: Every day | ORAL | 0 refills | 30 days
Start: 2022-05-31 — End: ?

## 2022-06-03 MED ORDER — RINVOQ 15 MG TABLET,EXTENDED RELEASE
ORAL_TABLET | Freq: Every day | ORAL | 0 refills | 30 days
Start: 2022-06-03 — End: ?

## 2022-06-03 MED ORDER — UPADACITINIB ER 15 MG TABLET,EXTENDED RELEASE 24 HR
ORAL_TABLET | Freq: Every day | ORAL | 0 refills | 30 days
Start: 2022-06-03 — End: ?

## 2022-06-07 NOTE — Unmapped (Signed)
[facility] Pharm[facility] Refill Coordination Note    Specialty Medication(s) to be Shipped:   Inflammatory Disorders: Rinvoq    Other medication(s) to be shipped: No additional medications requested for fill at this time     Jniya Kilgo, DOB: [birth year]  Phone: [phone] (home)       All above HIPAA information was verified with patient.     Was a Nurse, learning disability used for this call? No    Completed refill call assessment today to schedule patient's medication shipment from the [facility] [phone]).  All relevant notes have been reviewed.     Specialty medication(s) and dose(s) confirmed: Regimen is correct and unchanged.   Changes to medications: Harlynn reports no changes at this time.  Changes to insurance: No  New side effects reported not previously addressed with a pharmacist or physician: None reported  Questions for the pharmacist: No    Confirmed patient received a Conservation officer, historic buildings and a Surveyor, mining with first shipment. The patient will receive a drug information handout for each medication shipped and additional FDA Medication Guides as required.       DISEASE/MEDICATION-SPECIFIC INFORMATION        N/A    SPECIALTY MEDICATION ADHERENCE              Were doses missed due to medication being on hold? No    Rinvoq 15 mg: 7 days of medicine on hand       REFERRAL TO PHARMACIST     Referral to the pharmacist: Not needed      [facility]     Shipping address confirmed in Epic.       Delivery Scheduled: Yes, Expected medication delivery date: 06/11/22.     Medication will be delivered via UPS to the prescription address in Epic WAM.    [name], PharmD   [facility] Specialty Pharmacist

## 2022-06-11 MED FILL — RINVOQ 15 MG TABLET,EXTENDED RELEASE: ORAL | 30 days supply | Qty: 30 | Fill #0

## 2022-07-05 MED ORDER — RINVOQ 15 MG TABLET,EXTENDED RELEASE
ORAL_TABLET | Freq: Every day | ORAL | 0 refills | 30 days
Start: 2022-07-05 — End: ?

## 2022-07-05 NOTE — Unmapped (Addendum)
[facility] Refill Coordination Note    Specialty Medication(s) to be Shipped:   Inflammatory Disorders: Rinvoq    Other medication(s) to be shipped: No additional medications requested for fill at this time     Donna Levine, DOB: 1970/05/22  Phone: [phone] (home)       All above HIPAA information was verified with patient.     Was a Nurse, learning disability used for this call? No    Completed refill call assessment today to schedule patient's medication shipment from the [facility] [phone]).  All relevant notes have been reviewed.     Specialty medication(s) and dose(s) confirmed: Regimen is correct and unchanged.   Changes to medications: Alline reports no changes at this time.  Changes to insurance: No  New side effects reported not previously addressed with a pharmacist or physician: None reported  Questions for the pharmacist: No    Confirmed patient received a Conservation officer, historic buildings and a Surveyor, mining with first shipment. The patient will receive a drug information handout for each medication shipped and additional FDA Medication Guides as required.       DISEASE/MEDICATION-SPECIFIC INFORMATION        N/A    SPECIALTY MEDICATION ADHERENCE              Were doses missed due to medication being on hold? No    Rinvoq 15 mg: 7 days of medicine on hand       REFERRAL TO PHARMACIST     Referral to the pharmacist: Not needed      [facility]     Shipping address confirmed in Epic.       Delivery Scheduled: Yes, Expected medication delivery date: 07/09/22.  However, Rx request for refills was sent to the provider as there are none remaining.     Medication will be delivered via UPS to the prescription address in Epic WAM.    [name], PharmD   [facility] Specialty Pharmacist

## 2022-07-08 MED ORDER — UPADACITINIB ER 15 MG TABLET,EXTENDED RELEASE 24 HR
ORAL_TABLET | Freq: Every day | ORAL | 0 refills | 30 days
Start: 2022-07-08 — End: ?

## 2022-07-08 MED ORDER — RINVOQ 15 MG TABLET,EXTENDED RELEASE
ORAL_TABLET | Freq: Every day | ORAL | 0 refills | 30 days
Start: 2022-07-08 — End: ?

## 2022-07-09 NOTE — Unmapped (Signed)
Donna Levine 's RINVOQ 15 mg Tb24 (upadacitinib) shipment will be sent out  as a result of a new prescription for the medication has been received.      I have reached out to the patient  at [phone] and communicated the delivery change. We will reschedule the medication for the delivery date that the patient agreed upon.  We have confirmed the delivery date as 07/11/22

## 2022-07-10 MED FILL — RINVOQ 15 MG TABLET,EXTENDED RELEASE: ORAL | 30 days supply | Qty: 30 | Fill #0

## 2022-07-29 DIAGNOSIS — M79643 Pain in unspecified hand: Secondary | ICD-10-CM | POA: Diagnosis not present

## 2022-07-29 DIAGNOSIS — M0589 Other rheumatoid arthritis with rheumatoid factor of multiple sites: Secondary | ICD-10-CM | POA: Diagnosis not present

## 2022-07-29 DIAGNOSIS — M25461 Effusion, right knee: Secondary | ICD-10-CM | POA: Diagnosis not present

## 2022-07-29 DIAGNOSIS — M069 Rheumatoid arthritis, unspecified: Secondary | ICD-10-CM | POA: Diagnosis not present

## 2022-07-29 DIAGNOSIS — M25561 Pain in right knee: Secondary | ICD-10-CM | POA: Diagnosis not present

## 2022-07-29 DIAGNOSIS — Z79899 Other long term (current) drug therapy: Secondary | ICD-10-CM | POA: Diagnosis not present

## 2022-07-29 DIAGNOSIS — M25562 Pain in left knee: Secondary | ICD-10-CM | POA: Diagnosis not present

## 2022-07-29 DIAGNOSIS — L4050 Arthropathic psoriasis, unspecified: Secondary | ICD-10-CM | POA: Diagnosis not present

## 2022-08-07 MED ORDER — RINVOQ 15 MG TABLET,EXTENDED RELEASE
ORAL_TABLET | Freq: Every day | ORAL | 0 refills | 0 days
Start: 2022-08-07 — End: ?

## 2022-08-07 MED ORDER — UPADACITINIB ER 15 MG TABLET,EXTENDED RELEASE 24 HR
ORAL_TABLET | Freq: Every day | ORAL | 0 refills | 30 days
Start: 2022-08-07 — End: ?

## 2022-08-07 NOTE — Unmapped (Signed)
[facility] Clinical Intervention    Type of intervention: Medication access    Medication involved: Rinvoq    Problem identified: Patient needs new refills on her Rinvoq and just had an appointment    Intervention performed: Sent refill request to prescriber and will follow up with the patient to schedule once we receive the prescription     Follow-up needed: Contact patient once we receive prescription     Approximate time spent: 10-15 minutes    Clinical evidence used to support intervention: Chart review    Result of the intervention: Improved medication adherence    [name], PharmD   [facility] Specialty Pharmacist

## 2022-08-14 DIAGNOSIS — E1165 Type 2 diabetes mellitus with hyperglycemia: Secondary | ICD-10-CM | POA: Diagnosis not present

## 2022-08-14 DIAGNOSIS — E785 Hyperlipidemia, unspecified: Secondary | ICD-10-CM | POA: Diagnosis not present

## 2022-08-14 NOTE — Unmapped (Signed)
The [facility] has made a second and final attempt to reach this patient to refill the following medication:upadacitinib: RINVOQ 15 mg Tb24.      We have left voicemails on the following phone numbers: [phone] and have sent a text message to the following phone numbers: [phone] .    Dates contacted: 7/5, 7/10  Last scheduled delivery: 6/5- 30ds    The patient may be at risk of non-compliance with this medication. The patient should call the [facility] at [phone]  Option 4, then Option 2: Dermatology, Gastroenterology, Rheumatology to refill medication.    Gaspar Cola [facility] Specialty Technician

## 2022-08-23 NOTE — Unmapped (Signed)
[facility] Pharm[facility] Refill Coordination Note    Specialty Medication(s) to be Shipped:   Inflammatory Disorders: Rinvoq    Other medication(s) to be shipped: No additional medications requested for fill at this time     Donna Levine, DOB: 1970/05/22  Phone: [phone] (home)       All above HIPAA information was verified with patient.     Was a Nurse, learning disability used for this call? No    Completed refill call assessment today to schedule patient's medication shipment from the [facility] [phone]).  All relevant notes have been reviewed.     Specialty medication(s) and dose(s) confirmed: Regimen is correct and unchanged.   Changes to medications: Donna Levine reports no changes at this time.  Changes to insurance: No  New side effects reported not previously addressed with a pharmacist or physician: None reported  Questions for the pharmacist: No    Confirmed patient received a Conservation officer, historic buildings and a Surveyor, mining with first shipment. The patient will receive a drug information handout for each medication shipped and additional FDA Medication Guides as required.       DISEASE/MEDICATION-SPECIFIC INFORMATION        N/A    SPECIALTY MEDICATION ADHERENCE     Medication Adherence    Patient reported X missed doses in the last month: 0  Specialty Medication: RINVOQ 15 mg Tb24 (upadacitinib)  Patient is on additional specialty medications: No              Were doses missed due to medication being on hold? No    Rinvoq 15 mg: 4 days of medicine on hand        REFERRAL TO PHARMACIST     Referral to the pharmacist: Not needed      [facility]     Shipping address confirmed in Epic.       Delivery Scheduled: Yes, Expected medication delivery date: 08/27/22.     Medication will be delivered via UPS to the prescription address in Epic WAM.    Donna Levine   [facility] Specialty Technician

## 2022-08-26 MED FILL — RINVOQ 15 MG TABLET,EXTENDED RELEASE: ORAL | 30 days supply | Qty: 30 | Fill #0

## 2022-09-11 DIAGNOSIS — R519 Headache, unspecified: Secondary | ICD-10-CM | POA: Diagnosis not present

## 2022-09-11 DIAGNOSIS — R0683 Snoring: Secondary | ICD-10-CM | POA: Diagnosis not present

## 2022-09-11 DIAGNOSIS — Z9189 Other specified personal risk factors, not elsewhere classified: Secondary | ICD-10-CM | POA: Diagnosis not present

## 2022-09-11 DIAGNOSIS — G4719 Other hypersomnia: Secondary | ICD-10-CM | POA: Diagnosis not present

## 2022-09-17 MED ORDER — UPADACITINIB ER 15 MG TABLET,EXTENDED RELEASE 24 HR
ORAL_TABLET | Freq: Every day | ORAL | 0 refills | 30 days
Start: 2022-09-17 — End: ?

## 2022-09-17 MED ORDER — RINVOQ 15 MG TABLET,EXTENDED RELEASE
ORAL_TABLET | Freq: Every day | ORAL | 0 refills | 30 days
Start: 2022-09-17 — End: ?

## 2022-09-17 NOTE — Unmapped (Signed)
[facility] Pharm[facility] Refill Coordination Note    Specialty Medication(s) to be Shipped:   Inflammatory Disorders: Rinvoq    Other medication(s) to be shipped: No additional medications requested for fill at this time     Donna Levine, DOB: Aug 06, 1970  Phone: [phone] (home)       All above HIPAA information was verified with patient.     Was a Nurse, learning disability used for this call? No    Completed refill call assessment today to schedule patient's medication shipment from the [facility] [phone]).  All relevant notes have been reviewed.     Specialty medication(s) and dose(s) confirmed: Regimen is correct and unchanged.   Changes to medications: Elliot reports starting the following medications: lisinopril 2.5mg   Changes to insurance: No  New side effects reported not previously addressed with a pharmacist or physician: None reported  Questions for the pharmacist: No    Confirmed patient received a Conservation officer, historic buildings and a Surveyor, mining with first shipment. The patient will receive a drug information handout for each medication shipped and additional FDA Medication Guides as required.       DISEASE/MEDICATION-SPECIFIC INFORMATION        N/A    SPECIALTY MEDICATION ADHERENCE     Medication Adherence    Patient reported X missed doses in the last month: 0  Specialty Medication: RINVOQ 15 mg Tb24 (upadacitinib)  Patient is on additional specialty medications: No              Were doses missed due to medication being on hold? No    Rinvoq 15 mg: 4 days of medicine on hand        REFERRAL TO PHARMACIST     Referral to the pharmacist: Not needed      [facility]     Shipping address confirmed in Epic.       Delivery Scheduled: Yes, Expected medication delivery date: 8/20.  However, Rx request for refills was sent to the provider as there are none remaining.     Medication will be delivered via UPS to the prescription address in Epic WAM.    Gaspar Cola [facility] Specialty Technician

## 2022-09-19 DIAGNOSIS — H40013 Open angle with borderline findings, low risk, bilateral: Secondary | ICD-10-CM | POA: Diagnosis not present

## 2022-09-19 DIAGNOSIS — E119 Type 2 diabetes mellitus without complications: Secondary | ICD-10-CM | POA: Diagnosis not present

## 2022-09-19 DIAGNOSIS — H25013 Cortical age-related cataract, bilateral: Secondary | ICD-10-CM | POA: Diagnosis not present

## 2022-09-23 MED FILL — RINVOQ 15 MG TABLET,EXTENDED RELEASE: ORAL | 30 days supply | Qty: 30 | Fill #0

## 2022-10-18 MED ORDER — RINVOQ 15 MG TABLET,EXTENDED RELEASE
ORAL_TABLET | Freq: Every day | ORAL | 0 refills | 30.00000 days
Start: 2022-10-18 — End: ?

## 2022-10-18 NOTE — Unmapped (Signed)
[facility] Specialty and Home Delivery Pharmacy Refill Coordination Note    Specialty Medication(s) to be Shipped:   Inflammatory Disorders: Rinvoq    Other medication(s) to be shipped: No additional medications requested for fill at this time     Donna Levine, DOB: 07-07-1970  Phone: [phone] (home)       All above HIPAA information was verified with patient.     Was a Nurse, learning disability used for this call? No    Completed refill call assessment today to schedule patient's medication shipment from the [name] and [facility] Delivery Pharmacy  [phone]).  All relevant notes have been reviewed.     Specialty medication(s) and dose(s) confirmed: Regimen is correct and unchanged.   Changes to medications: Donna Levine reports no changes at this time.  Changes to insurance: No  New side effects reported not previously addressed with a pharmacist or physician: None reported  Questions for the pharmacist: No    Confirmed patient received a Conservation officer, historic buildings and a Surveyor, mining with first shipment. The patient will receive a drug information handout for each medication shipped and additional FDA Medication Guides as required.       DISEASE/MEDICATION-SPECIFIC INFORMATION        N/A    SPECIALTY MEDICATION ADHERENCE     Medication Adherence    Patient reported X missed doses in the last month: 0  Specialty Medication: upadacitinib (RINVOQ) 15 mg Tb24  Patient is on additional specialty medications: No  Patient is on more than two specialty medications: No  Informant: patient              Were doses missed due to medication being on hold? No    Rinvoq 15 mg: 7 days of medicine on hand       REFERRAL TO PHARMACIST     Referral to the pharmacist: Not needed      [name]     Shipping address confirmed in Epic.       Delivery Scheduled: Yes, Expected medication delivery date: 10/23/22.  However, Rx request for refills was sent to the provider as there are none remaining.     Medication will be delivered via UPS to the prescription address in Epic WAM.    [name], PharmD   [name]  Specialty Pharmacist

## 2022-10-22 NOTE — Unmapped (Signed)
Donna Levine 's RINVOQ 15 mg Tb24 (upadacitinib) shipment will be delayed as a result of no refills remain on the prescription.      I have spoken with the patient  at [phone]  and communicated the delay. We will call the patient back to reschedule the delivery upon resolution. We have not confirmed the new delivery date.

## 2022-10-24 MED ORDER — UPADACITINIB ER 15 MG TABLET,EXTENDED RELEASE 24 HR
ORAL_TABLET | Freq: Every day | ORAL | 0 refills | 30 days
Start: 2022-10-24 — End: ?

## 2022-10-24 MED ORDER — RINVOQ 15 MG TABLET,EXTENDED RELEASE
ORAL_TABLET | Freq: Every day | ORAL | 0 refills | 30 days
Start: 2022-10-24 — End: ?

## 2022-10-27 IMAGING — DX DG SACRUM/COCCYX 2+V
3 series · 3 of 3 positions shown · non-contrast
Comparison: None.

CLINICAL DATA: Fell 02/21/2020.  Sacrum/coccyx pain.

EXAM:
SACRUM AND COCCYX - 2+ VIEW

[coccyx ap]
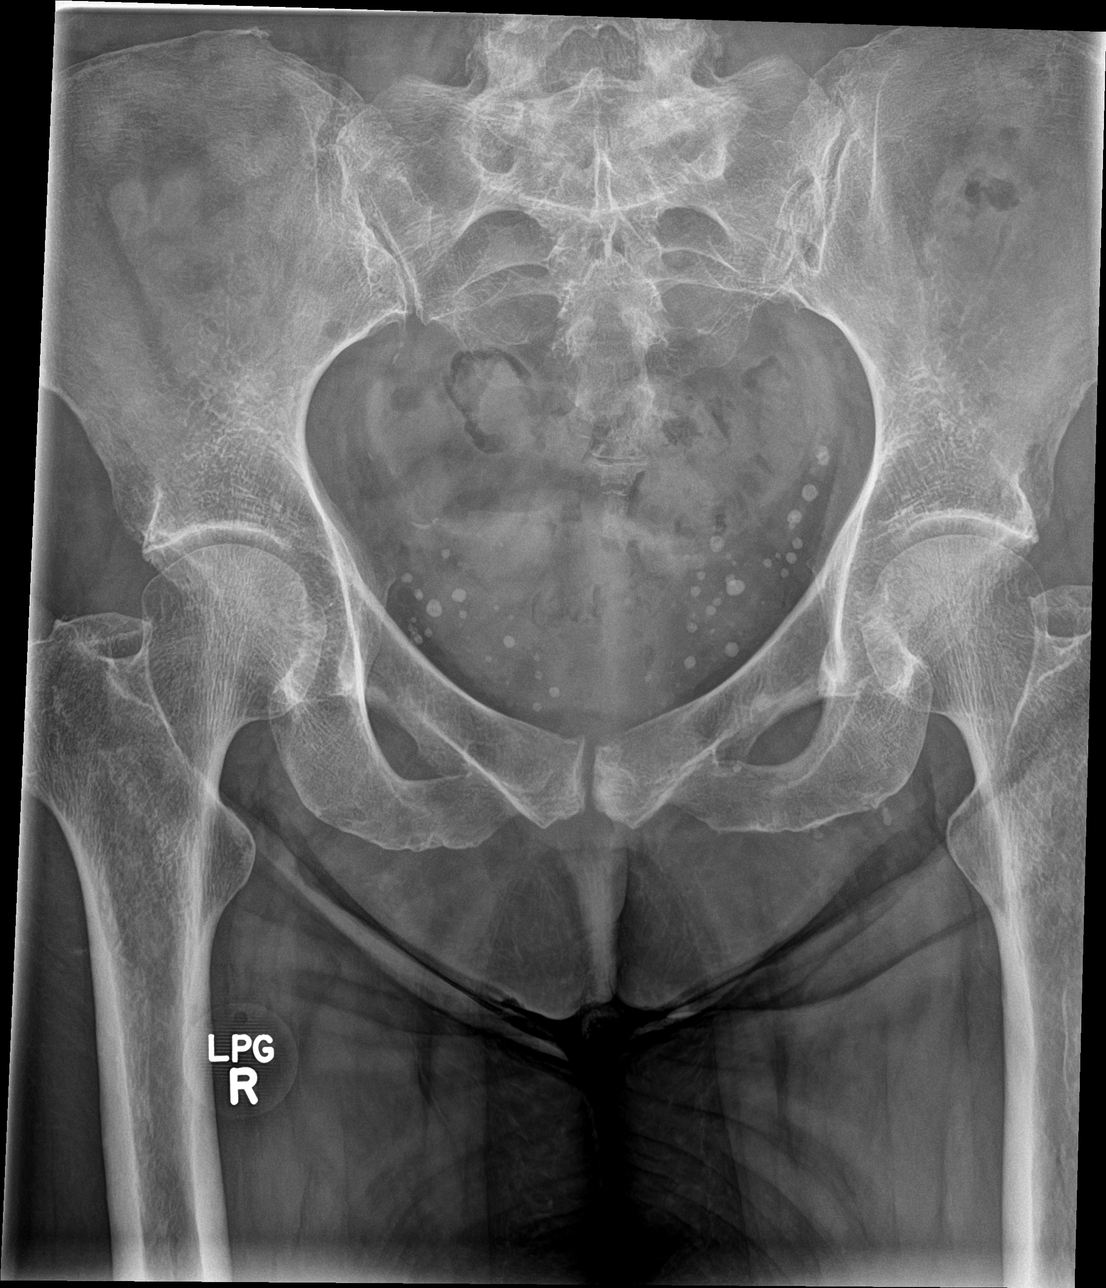

[sacrum ap]
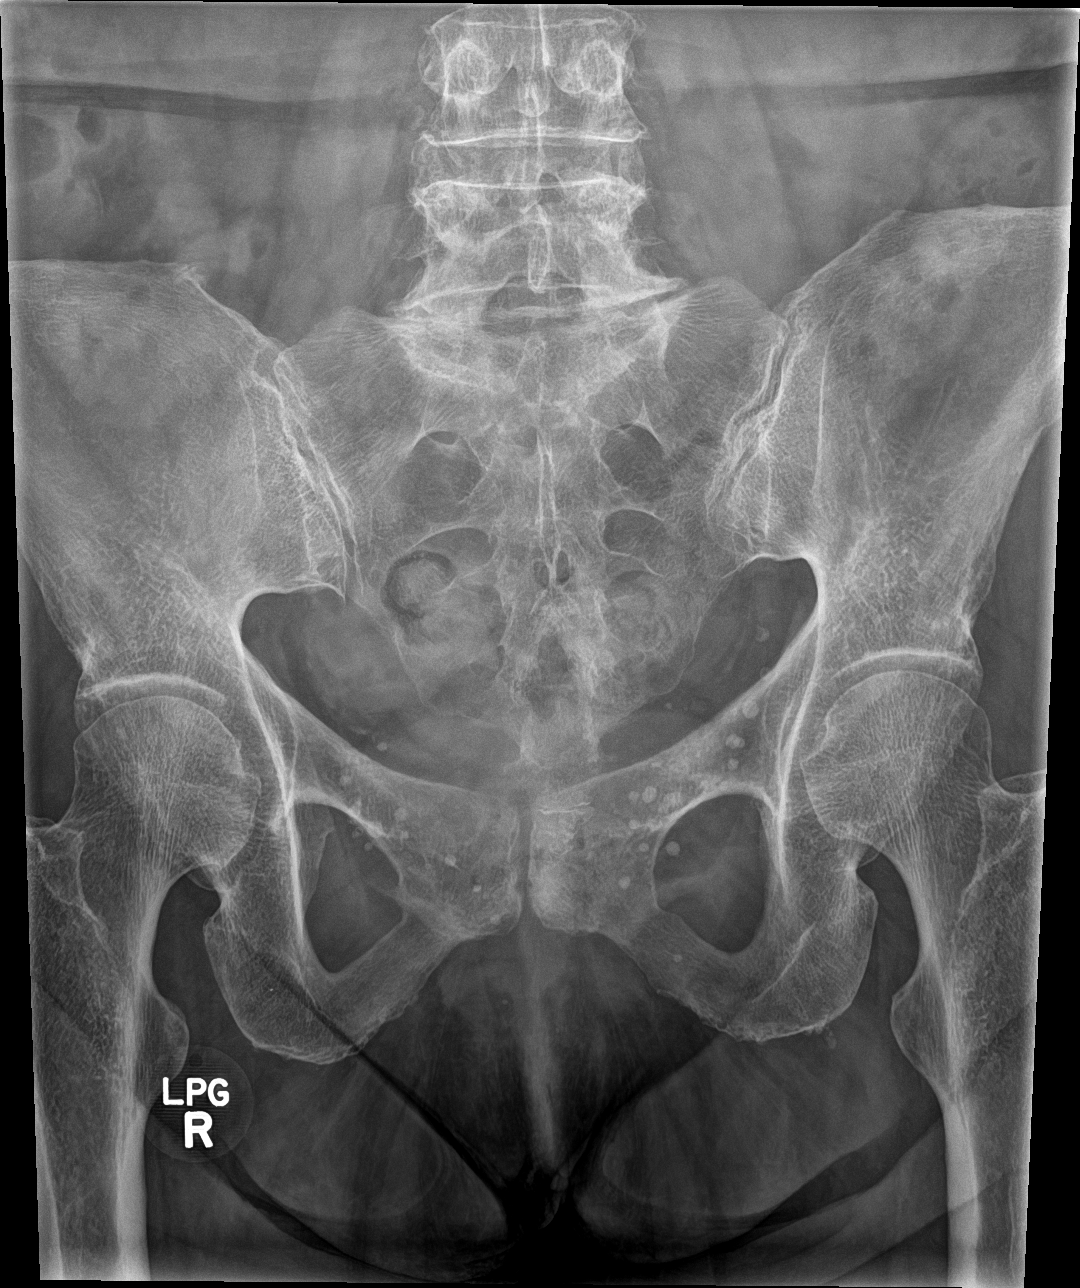

[sacrum lat]
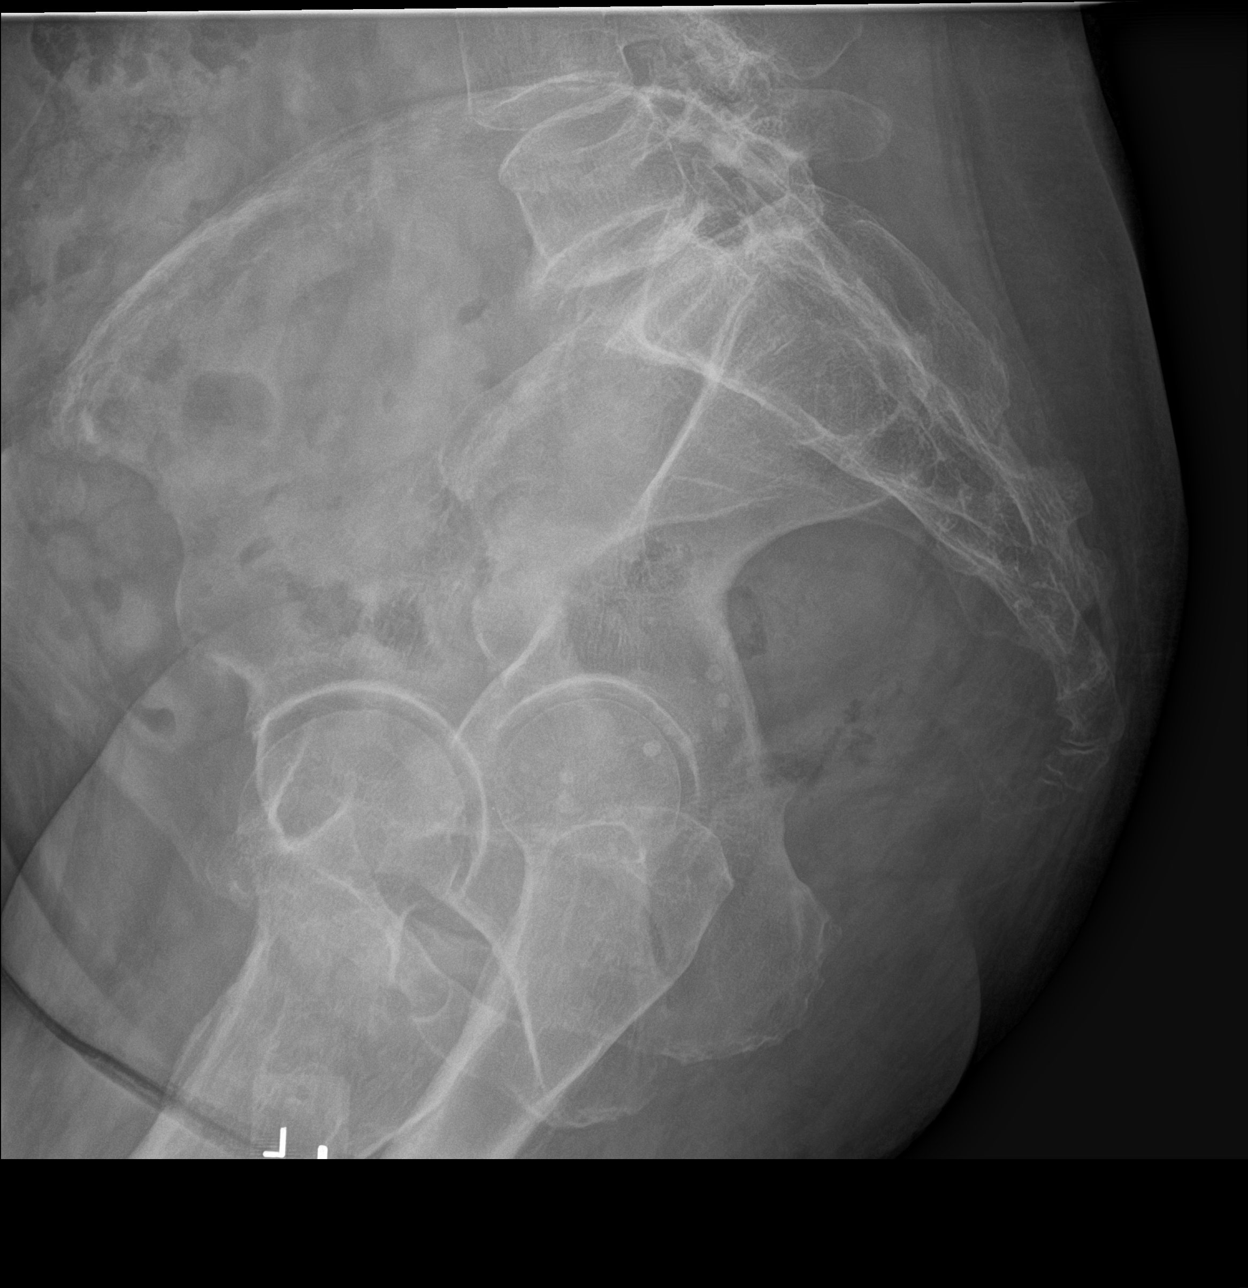

[3 of 3 positions shown; findings below may reference images not displayed]

FINDINGS: Both hips are normally located. No hip fracture or AVN. The pubic
symphysis and SI joints are intact. No pelvic fractures are
identified.

The sacrum is intact. No sacral fractures are identified. The coccyx
is intact.
IMPRESSION: No acute bony findings.

## 2022-10-29 NOTE — Unmapped (Signed)
Donna Levine 's RINVOQ 15 mg Tb24 (upadacitinib) shipment will be rescheduled as a result of a new prescription for the medication has been received.      I have spoken with the patient  at [phone]  and communicated the delivery change. We will reschedule the medication for the delivery date that the patient agreed upon.  We have confirmed the delivery date as 10/31/22

## 2022-10-30 MED FILL — RINVOQ 15 MG TABLET,EXTENDED RELEASE: ORAL | 30 days supply | Qty: 30 | Fill #0

## 2022-10-31 DIAGNOSIS — Z79899 Other long term (current) drug therapy: Secondary | ICD-10-CM | POA: Diagnosis not present

## 2022-10-31 DIAGNOSIS — M0589 Other rheumatoid arthritis with rheumatoid factor of multiple sites: Secondary | ICD-10-CM | POA: Diagnosis not present

## 2022-10-31 DIAGNOSIS — L4050 Arthropathic psoriasis, unspecified: Secondary | ICD-10-CM | POA: Diagnosis not present

## 2022-10-31 DIAGNOSIS — M25461 Effusion, right knee: Secondary | ICD-10-CM | POA: Diagnosis not present

## 2022-11-04 MED ORDER — RINVOQ 15 MG TABLET,EXTENDED RELEASE
ORAL_TABLET | Freq: Every day | ORAL | 0 refills | 30 days
Start: 2022-11-04 — End: ?

## 2022-11-05 MED ORDER — RINVOQ 15 MG TABLET,EXTENDED RELEASE
ORAL_TABLET | Freq: Every day | ORAL | 0 refills | 30 days
Start: 2022-11-05 — End: ?

## 2022-11-05 MED ORDER — UPADACITINIB ER 15 MG TABLET,EXTENDED RELEASE 24 HR
ORAL_TABLET | Freq: Every day | ORAL | 0 refills | 30 days
Start: 2022-11-05 — End: ?

## 2022-11-20 MED ORDER — RINVOQ 15 MG TABLET,EXTENDED RELEASE
ORAL_TABLET | Freq: Every day | ORAL | 0 refills | 30 days
Start: 2022-11-20 — End: ?

## 2022-11-20 MED ORDER — UPADACITINIB ER 15 MG TABLET,EXTENDED RELEASE 24 HR
ORAL_TABLET | Freq: Every day | ORAL | 0 refills | 30 days
Start: 2022-11-20 — End: ?

## 2022-11-20 NOTE — Unmapped (Signed)
[facility] Specialty and Home Delivery Pharmacy Refill Coordination Note    Specialty Medication(s) to be Shipped:   Inflammatory Disorders: Rinvoq    Other medication(s) to be shipped: No additional medications requested for fill at this time     Donna Levine, DOB: 1970/07/07  Phone: [phone] (home)       All above HIPAA information was verified with patient.     Was a Nurse, learning disability used for this call? No    Completed refill call assessment today to schedule patient's medication shipment from the [facility] and Home Delivery Pharmacy  [phone]).  All relevant notes have been reviewed.     Specialty medication(s) and dose(s) confirmed: Regimen is correct and unchanged.   Changes to medications: Alyzon reports no changes at this time.  Changes to insurance: No  New side effects reported not previously addressed with a pharmacist or physician: None reported  Questions for the pharmacist: No    Confirmed patient received a Conservation officer, historic buildings and a Surveyor, mining with first shipment. The patient will receive a drug information handout for each medication shipped and additional FDA Medication Guides as required.       DISEASE/MEDICATION-SPECIFIC INFORMATION        N/A    SPECIALTY MEDICATION ADHERENCE     Medication Adherence    Patient reported X missed doses in the last month: 0  Specialty Medication: RINVOQ 15 mg Tb24 (upadacitinib)  Patient is on additional specialty medications: No              Were doses missed due to medication being on hold? No    Rinvoq 15 mg: 6 days of medicine on hand       REFERRAL TO PHARMACIST     Referral to the pharmacist: Not needed      [facility]     Shipping address confirmed in Epic.       Delivery Scheduled: Yes, Expected medication delivery date: 10/18.     Medication will be delivered via UPS to the prescription address in Epic WAM.    [facility]  Specialty Technician

## 2022-11-21 MED FILL — RINVOQ 15 MG TABLET,EXTENDED RELEASE: ORAL | 30 days supply | Qty: 30 | Fill #0

## 2022-12-11 NOTE — Unmapped (Signed)
[facility] Specialty and Home Delivery Pharmacy Refill Coordination Note    Specialty Medication(s) to be Shipped:   Inflammatory Disorders: Rinvoq    Other medication(s) to be shipped: No additional medications requested for fill at this time     Donna Levine, DOB: [birth year]  Phone: [phone] (home)       All above HIPAA information was verified with patient.     Was a Nurse, learning disability used for this call? No    Completed refill call assessment today to schedule patient's medication shipment from the [facility] and Home Delivery Pharmacy  [phone]).  All relevant notes have been reviewed.     Specialty medication(s) and dose(s) confirmed: Regimen is correct and unchanged.   Changes to medications: Kalen reports no changes at this time.  Changes to insurance: No  New side effects reported not previously addressed with a pharmacist or physician: None reported  Questions for the pharmacist: No    Confirmed patient received a Conservation officer, historic buildings and a Surveyor, mining with first shipment. The patient will receive a drug information handout for each medication shipped and additional FDA Medication Guides as required.       DISEASE/MEDICATION-SPECIFIC INFORMATION        N/A    SPECIALTY MEDICATION ADHERENCE     Medication Adherence    Patient reported X missed doses in the last month: 0  Specialty Medication: RINVOQ 15 mg Tb24 (upadacitinib)  Patient is on additional specialty medications: No              Were doses missed due to medication being on hold? No    Rinvoq 15 mg: unsure days of medicine on hand       REFERRAL TO PHARMACIST     Referral to the pharmacist: Not needed      [facility]     Shipping address confirmed in Epic.       Delivery Scheduled: Yes, Expected medication delivery date: 11/8.     Medication will be delivered via UPS to the prescription address in Epic WAM.    [facility]  Specialty Technician

## 2022-12-12 NOTE — Unmapped (Signed)
Klynn Wehmeyer 's RINVOQ 15 mg Tb24 (upadacitinib) shipment will be delayed as a result of the medication is too soon to refill until 12/16/22.     I have reached out to the patient  at [phone]  and left a voicemail message.  We will wait for a call back from the patient to reschedule the delivery.  We have not confirmed the new delivery date.

## 2022-12-16 MED FILL — RINVOQ 15 MG TABLET,EXTENDED RELEASE: ORAL | 30 days supply | Qty: 30 | Fill #0

## 2022-12-16 NOTE — Unmapped (Signed)
Donna Levine 's RINVOQ 15 mg Tb24 (upadacitinib) shipment will be rescheduled as a result of the medication is no longer refill too soon.      I have spoken with the patient  at [phone]  and communicated the delivery change. We will reschedule the medication for the delivery date that the patient agreed upon.  We have confirmed the delivery date as 12/17/22

## 2023-01-01 MED ORDER — UPADACITINIB ER 15 MG TABLET,EXTENDED RELEASE 24 HR
ORAL_TABLET | Freq: Every day | ORAL | 0 refills | 30 days
Start: 2023-01-01 — End: ?

## 2023-01-01 MED ORDER — RINVOQ 15 MG TABLET,EXTENDED RELEASE
ORAL_TABLET | Freq: Every day | ORAL | 0 refills | 30 days
Start: 2023-01-01 — End: ?

## 2023-01-07 DIAGNOSIS — R059 Cough, unspecified: Secondary | ICD-10-CM | POA: Diagnosis not present

## 2023-01-07 DIAGNOSIS — A084 Viral intestinal infection, unspecified: Secondary | ICD-10-CM | POA: Diagnosis not present

## 2023-01-07 DIAGNOSIS — J209 Acute bronchitis, unspecified: Secondary | ICD-10-CM | POA: Diagnosis not present

## 2023-01-07 DIAGNOSIS — J019 Acute sinusitis, unspecified: Secondary | ICD-10-CM | POA: Diagnosis not present

## 2023-01-08 DIAGNOSIS — M0579 Rheumatoid arthritis with rheumatoid factor of multiple sites without organ or systems involvement: Principal | ICD-10-CM

## 2023-01-08 NOTE — Unmapped (Signed)
Per clinic/provider, their office will be completing the prior authorization for this medication. MAP team has requested the clinic follow up with the [facility] Special[facility] and Home Delivery Pharm[facility] and/or patient regarding the determination of the prior authorization. MAP referral will be closed at this time. Pt is aware and was told to reach out to the clinic

## 2023-01-27 DIAGNOSIS — E118 Type 2 diabetes mellitus with unspecified complications: Secondary | ICD-10-CM | POA: Diagnosis not present

## 2023-01-27 DIAGNOSIS — Z79899 Other long term (current) drug therapy: Secondary | ICD-10-CM | POA: Diagnosis not present

## 2023-01-27 DIAGNOSIS — R946 Abnormal results of thyroid function studies: Secondary | ICD-10-CM | POA: Diagnosis not present

## 2023-02-11 DIAGNOSIS — J329 Chronic sinusitis, unspecified: Secondary | ICD-10-CM | POA: Diagnosis not present

## 2023-02-11 DIAGNOSIS — Z0000 Encounter for general adult medical examination without abnormal findings: Secondary | ICD-10-CM | POA: Diagnosis not present

## 2023-02-17 NOTE — Unmapped (Signed)
The office is still working on getting the Rinvoq approved through the insurance. The provider will reach out to the patient to let them know.    [place], [place]. DMarland Kitchen  Clinical Pharmacist - [facility]  [address] [place], [place] [address]  Phone: [phone] - Fax. [phone]

## 2023-02-20 DIAGNOSIS — E785 Hyperlipidemia, unspecified: Secondary | ICD-10-CM | POA: Diagnosis not present

## 2023-02-20 DIAGNOSIS — I10 Essential (primary) hypertension: Secondary | ICD-10-CM | POA: Diagnosis not present

## 2023-02-20 DIAGNOSIS — E1165 Type 2 diabetes mellitus with hyperglycemia: Secondary | ICD-10-CM | POA: Diagnosis not present

## 2023-03-01 DIAGNOSIS — S46911A Strain of unspecified muscle, fascia and tendon at shoulder and upper arm level, right arm, initial encounter: Secondary | ICD-10-CM | POA: Diagnosis not present

## 2023-03-06 DIAGNOSIS — L4050 Arthropathic psoriasis, unspecified: Secondary | ICD-10-CM | POA: Diagnosis not present

## 2023-03-06 DIAGNOSIS — Z79899 Other long term (current) drug therapy: Secondary | ICD-10-CM | POA: Diagnosis not present

## 2023-03-06 DIAGNOSIS — M0589 Other rheumatoid arthritis with rheumatoid factor of multiple sites: Secondary | ICD-10-CM | POA: Diagnosis not present

## 2023-03-06 DIAGNOSIS — R509 Fever, unspecified: Secondary | ICD-10-CM | POA: Diagnosis not present

## 2023-03-06 MED ORDER — RINVOQ 15 MG TABLET,EXTENDED RELEASE
ORAL_TABLET | Freq: Every day | ORAL | 0 refills | 30.00 days
Start: 2023-03-06 — End: ?

## 2023-03-24 MED ORDER — RINVOQ 15 MG TABLET,EXTENDED RELEASE
ORAL_TABLET | 0 refills | 0.00 days
Start: 2023-03-24 — End: ?

## 2023-03-26 NOTE — Unmapped (Signed)
 [place] [facility] and [facility] Delivery Pharmacy    Patient Onboarding/Medication Counseling    Donna Levine is a 53 y.o. female with Rheumatoid Arthritis who I am counseling today on continuation of therapy.  I am speaking to the patient.    Was a Nurse, learning disability used for this call? No    Verified patient's date of birth / HIPAA.    Specialty medication(s) to be sent: Inflammatory Disorders: Rinvoq      Non-specialty medications/supplies to be sent: None      Medications not needed at this time: N/a         Rinvoq (upadacitinib)  The patient declined counseling on medication administration, missed dose instructions, goals of therapy, side effects and monitoring parameters, warnings and precautions, drug/food interactions, and storage, handling precautions, and disposal because they have taken the medication previously. The information in the declined sections below are for informational purposes only and was not discussed with patient.     Medication & Administration     Dosage: Rheumatoid arthritis: Take 15mg  by mouth once daily    Lab tests required prior to treatment initiation:  Tuberculosis: Not documented in chart but patient confirms all pre-labs have been completed around early December 2023.   Hepatitis B: Not documented in chart but patient confirms all pre-labs have been completed around early December 2023.  Absolute lymphocyte count: Not documented in chart but patient confirms all pre-labs have been completed around early December 2023.  Absolute neutrophil count: Not documented in chart but patient confirms all pre-labs have been completed around early December 2023.  Hemoglobin: Not documented in chart but patient confirms all pre-labs have been completed around early December 2023.  Liver function tests: Not documented in chart but patient confirms all pre-labs have been completed around early December 2023.  Pregnancy: Not documented in chart but patient confirms not pregnant      Administration: Take tablets with or without food.  Swallow tablets whole; do not chew, break, or crush.    Adherence/Missed dose instructions:  Take a missed dose as soon as you think about it.  If it has been more than 8-12 hours since your normal dosing time, skip the missed dose and go back to your normal time the following day.  Never take 2 doses at the same time or extra doses in an attempt to 'catch up' after a missed dose.    Goals of Therapy     Achieve symptom remission  Slow disease progression  Protection of remaining articular structures  Maintenance of function  Maintenance of effective psychosocial functioning    Side Effects & Monitoring Parameters     Upset stomach  Signs of a common cold - minor sore throat, runny or stuffy nose, etc.    The following side effects should be reported to the provider:  Signs of a hypersensitivity reaction - rash; hives; itching; red, swollen, blistered, or peeling skin; wheezing; tightness in the chest or throat; difficulty breathing, swallowing, or talking; swelling of the mouth, face, lips, tongue, or throat; etc.  Reduced immune function - report signs of infection such as fever; chills; body aches; very bad sore throat; ear or sinus pain; cough; more sputum or change in color of sputum; pain with passing urine; wound that will not heal, etc.  Also at a slightly higher risk of some malignancies (mainly skin and blood cancers) due to this reduced immune function.  A new skin growth or lump  Stomach pain that is new or worse or change  in bowel habits  Signs of a blood clot - chest pain or pressure; coughing up blood; shortness of breath; swelling, warmth, numbness, or pain in a leg or arm      Contraindications, Warnings, & Precautions     Have your bloodwork checked as you have been told by your prescriber  Talk with your doctor if you are pregnant, planning to become pregnant, or breastfeeding - women taking this medication must use birth control while taking this drug and for some time after the last dose  Discuss the possible need for holding your dose(s) of Rinvoq?? when a planned procedure is scheduled with the prescriber as it may delay healing/recovery timeline       Drug/Food Interactions     Medication list reviewed in Epic. The patient was instructed to inform the care team before taking any new medications or supplements. No drug interactions identified.   Talk with you prescriber or pharmacist before receiving any live vaccinations while taking this medication and after you stop taking it    Storage, Handling Precautions, & Disposal     Store in the original container at room temperature and protect from moisture.      Current Medications (including OTC/herbals), Comorbidities and Allergies     Current Outpatient Medications   Medication Sig Dispense Refill    calcium carbonate/vitamin D3 (CALCIUM 600 + D,3, ORAL) Take by mouth.      cyanocobalamin, vitamin B-12, (VITAMIN B-12 ORAL) Take by mouth.      metformin HCl (METFORMIN ORAL) Take by mouth.      upadacitinib (RINVOQ) 15 mg Tb24 Take 1 tablet (15 mg) by mouth daily. 30 tablet 0    upadacitinib (RINVOQ) 15 mg Tb24 Take 1 tablet by mouth daily for 30 days. 30 tablet 0    upadacitinib (RINVOQ) 15 mg Tb24 Take 1 tablet by mouth daily. 30 days 30 tablet 0     No current facility-administered medications for this visit.       Allergies   Allergen Reactions    Coconut Hives, Shortness Of Breath and Swelling    Pecan Nut Hives    Walnut Hives       There is no problem list on file for this patient.      Medication list has been reviewed and updated in Epic: Yes    Allergies have been reviewed and updated in Epic: Yes    Appropriateness of Therapy     Acute infections noted within Epic:  No active infections  Patient reported infection: None    Is the medication and dose appropriate based on diagnosis, medication list, comorbidities, allergies, medical history, patient???s ability to self-administer the medication, and therapeutic goals? Yes    Prescription has been clinically reviewed: Yes      Baseline Quality of Life Assessment      How many days over the past month did your condition  keep you from your normal activities? For example, brushing your teeth or getting up in the morning. 0    Financial Information     Medication Assistance provided: Prior Authorization and Copay Assistance    Anticipated copay of $0 / 30DS reviewed with patient. Verified delivery address.    Delivery Information     Scheduled delivery date: 04/01/2023    Expected start date: 04/01/2023      Medication will be delivered via UPS to the prescription address in [facility].  This shipment will not require a signature.  Explained the services we provide at [facility] Special[facility] and Home Delivery Pharm[facility] and that each month we would call to set up refills.  Stressed importance of returning phone calls so that we could ensure they receive their medications in time each month.  Informed patient that we should be setting up refills 7-10 days prior to when they will run out of medication.  A pharmacist will reach out to perform a clinical assessment periodically.  Informed patient that a welcome packet, containing information about our pharmacy and other support services, a Notice of Privacy Practices, and a drug information handout will be sent.      The patient or caregiver noted above participated in the development of this care plan and knows that they can request review of or adjustments to the care plan at any time.      Patient or caregiver verbalized understanding of the above information as well as how to contact the pharmacy at [phone] option 4 with any questions/concerns.  The pharmacy is open Monday through Friday 8:30am-4:30pm.  A pharmacist is available 24/7 via pager to answer any clinical questions they may have.    Patient Specific Needs     Does the patient have any physical, cognitive, or cultural barriers? No    Does the patient have adequate living arrangements? (i.e. the ability to store and take their medication appropriately) Yes    Did you identify any home environmental safety or security hazards? No    Patient prefers to have medications discussed with  Patient     Is the patient or caregiver able to read and understand education materials at a high school level or above? Yes    Patient's primary language is  English     Is the patient high risk? No    Does the patient have an additional or emergency contact listed in their chart? Yes    SOCIAL DETERMINANTS OF HEALTH     At the [facility], we have learned that life circumstances - like trouble affording food, housing, utilities, or transportation can affect the health of many of our patients.   That is why we wanted to ask: are you currently experiencing any life circumstances that are negatively impacting your health and/or quality of life? No    Social Drivers of Engineer, water Insecurity: Not on file   Internet Connectivity: Not on file   Housing/Utilities: Not on file   Tobacco Use: Not on file   Transportation Needs: Not on file   Alcohol Use: Not on file   Interpersonal Safety: Not on file   Physical Activity: Not on file   Intimate Partner Violence: Not on file   Stress: Not on file   Substance Use: Not on file (03/26/2023)   Social Connections: Not on file   Financial Resource Strain: Not on file   Depression: Not on file   Health Literacy: Not on file       Would you be willing to receive help with any of the needs that you have identified today? No       Elnora Morrison, PharmD  [facility] Specialty and Home Delivery Pharmacy Specialty Pharmacist

## 2023-03-31 MED FILL — RINVOQ 15 MG TABLET,EXTENDED RELEASE: ORAL | 30 days supply | Qty: 30 | Fill #0

## 2023-04-03 ENCOUNTER — Other Ambulatory Visit (HOSPITAL_BASED_OUTPATIENT_CLINIC_OR_DEPARTMENT_OTHER): Payer: Self-pay | Admitting: Nurse Practitioner

## 2023-04-03 DIAGNOSIS — E785 Hyperlipidemia, unspecified: Secondary | ICD-10-CM

## 2023-04-12 DIAGNOSIS — J029 Acute pharyngitis, unspecified: Secondary | ICD-10-CM | POA: Diagnosis not present

## 2023-04-12 DIAGNOSIS — A0472 Enterocolitis due to Clostridium difficile, not specified as recurrent: Secondary | ICD-10-CM | POA: Diagnosis not present

## 2023-04-17 ENCOUNTER — Ambulatory Visit (HOSPITAL_BASED_OUTPATIENT_CLINIC_OR_DEPARTMENT_OTHER)
Admission: RE | Admit: 2023-04-17 | Discharge: 2023-04-17 | Disposition: A | Discharge status: Home / Self Care | Payer: Self-pay | Source: Ambulatory Visit | Attending: Nurse Practitioner | Admitting: Nurse Practitioner

## 2023-04-17 DIAGNOSIS — M25461 Effusion, right knee: Secondary | ICD-10-CM | POA: Diagnosis not present

## 2023-04-17 DIAGNOSIS — Z79899 Other long term (current) drug therapy: Secondary | ICD-10-CM | POA: Diagnosis not present

## 2023-04-17 DIAGNOSIS — M8580 Other specified disorders of bone density and structure, unspecified site: Secondary | ICD-10-CM | POA: Diagnosis not present

## 2023-04-17 DIAGNOSIS — E785 Hyperlipidemia, unspecified: Secondary | ICD-10-CM | POA: Insufficient documentation

## 2023-04-17 DIAGNOSIS — M25561 Pain in right knee: Secondary | ICD-10-CM | POA: Diagnosis not present

## 2023-04-17 DIAGNOSIS — M0589 Other rheumatoid arthritis with rheumatoid factor of multiple sites: Secondary | ICD-10-CM | POA: Diagnosis not present

## 2023-04-17 DIAGNOSIS — L4050 Arthropathic psoriasis, unspecified: Secondary | ICD-10-CM | POA: Diagnosis not present

## 2023-04-21 NOTE — Unmapped (Signed)
 [facility] Specialty and Home Delivery Pharmacy Refill Coordination Note    Specialty Medication(s) to be Shipped:   Inflammatory Disorders: Rinvoq    Other medication(s) to be shipped: No additional medications requested for fill at this time     Donna Levine, DOB: 06/17/1970  Phone: [phone] (home)       All above HIPAA information was verified with patient.     Was a Nurse, learning disability used for this call? No    Completed refill call assessment today to schedule patient's medication shipment from the [facility] and Home Delivery Pharmacy  [phone]).  All relevant notes have been reviewed.     Specialty medication(s) and dose(s) confirmed: Regimen is correct and unchanged.   Changes to medications: Ermelinda reports no changes at this time.  Changes to insurance: No  New side effects reported not previously addressed with a pharmacist or physician: None reported  Questions for the pharmacist: No    Confirmed patient received a Conservation officer, historic buildings and a Surveyor, mining with first shipment. The patient will receive a drug information handout for each medication shipped and additional FDA Medication Guides as required.       DISEASE/MEDICATION-SPECIFIC INFORMATION        N/A    SPECIALTY MEDICATION ADHERENCE              Were doses missed due to medication being on hold? No    Rinvoq 15mg  : 7 days of medicine on hand     REFERRAL TO PHARMACIST     Referral to the pharmacist: Not needed      [facility]     Shipping address confirmed in Epic.     Cost and Payment: Patient has a $0 copay, payment information is not required.    Delivery Scheduled: Yes, Expected medication delivery date: 04/23/2023.     Medication will be delivered via UPS to the prescription address in Epic WAM.    Elnora Morrison, PharmD   [facility] Specialty and Home Delivery Pharmacy  Specialty Pharmacist

## 2023-04-22 MED FILL — RINVOQ 15 MG TABLET,EXTENDED RELEASE: ORAL | 30 days supply | Qty: 30 | Fill #0

## 2023-05-14 NOTE — Unmapped (Signed)
 [facility] Specialty and Home Delivery Pharmacy Refill Coordination Note    Specialty Medication(s) to be Shipped:   Inflammatory Disorders: Rinvoq    Other medication(s) to be shipped: No additional medications requested for fill at this time     Donna Levine, DOB: 27-May-1970  Phone: [phone] (home)       All above HIPAA information was verified with patient.     Was a Nurse, learning disability used for this call? No    Completed refill call assessment today to schedule patient's medication shipment from the [facility] and Home Delivery Pharmacy  [phone]).  All relevant notes have been reviewed.     Specialty medication(s) and dose(s) confirmed: Regimen is correct and unchanged.   Changes to medications: Faron reports no changes at this time.  Changes to insurance: No  New side effects reported not previously addressed with a pharmacist or physician: None reported  Questions for the pharmacist: No    Confirmed patient received a Conservation officer, historic buildings and a Surveyor, mining with first shipment. The patient will receive a drug information handout for each medication shipped and additional FDA Medication Guides as required.       DISEASE/MEDICATION-SPECIFIC INFORMATION        N/A    SPECIALTY MEDICATION ADHERENCE              Were doses missed due to medication being on hold? No    Rinvoq 15 mg: 10 days of medicine on hand     REFERRAL TO PHARMACIST     Referral to the pharmacist: Not needed      [facility]     Shipping address confirmed in Epic.     Cost and Payment: Patient has a $0 copay, payment information is not required.    Delivery Scheduled: Yes, Expected medication delivery date: 05/22/2023.     Medication will be delivered via UPS to the prescription address in Epic WAM.    Elnora Morrison, PharmD   [facility] Specialty and Home Delivery Pharmacy  Specialty Pharmacist

## 2023-05-21 MED FILL — RINVOQ 15 MG TABLET,EXTENDED RELEASE: 30 days supply | Qty: 30 | Fill #0

## 2023-06-11 MED ORDER — RINVOQ 15 MG TABLET,EXTENDED RELEASE
ORAL_TABLET | 0 refills | 0.00000 days
Start: 2023-06-11 — End: ?

## 2023-06-11 MED ORDER — UPADACITINIB ER 15 MG TABLET,EXTENDED RELEASE 24 HR
ORAL_TABLET | 0 refills | 0.00000 days
Start: 2023-06-11 — End: ?

## 2023-06-26 DIAGNOSIS — E1165 Type 2 diabetes mellitus with hyperglycemia: Secondary | ICD-10-CM | POA: Diagnosis not present

## 2023-06-26 DIAGNOSIS — E785 Hyperlipidemia, unspecified: Secondary | ICD-10-CM | POA: Diagnosis not present

## 2023-06-26 NOTE — Unmapped (Signed)
 The [facility] has made a second and final attempt to reach this patient to refill the following medication:Rinvoq .      We have left voicemails on the following phone numbers: [phone] and have sent a text message to the following phone numbers: [phone].    Dates contacted: 5/7,22  Last scheduled delivery: 05/21/23    The patient may be at risk of non-compliance with this medication. The patient should call the [facility] at [phone]  Option 4, then Option 1: Oncology to refill medication.    [facility] Specialty Technician

## 2023-07-01 DIAGNOSIS — E1165 Type 2 diabetes mellitus with hyperglycemia: Secondary | ICD-10-CM | POA: Diagnosis not present

## 2023-07-01 DIAGNOSIS — I10 Essential (primary) hypertension: Secondary | ICD-10-CM | POA: Diagnosis not present

## 2023-07-01 DIAGNOSIS — E785 Hyperlipidemia, unspecified: Secondary | ICD-10-CM | POA: Diagnosis not present

## 2023-07-02 NOTE — Unmapped (Signed)
 [facility] Specialty and Home Delivery Pharmacy Refill Coordination Note    Specialty Medication(s) to be Shipped:   Inflammatory Disorders: Rinvoq     Other medication(s) to be shipped: No additional medications requested for fill at this time     Donna Levine, DOB: Jul 06, 1970  Phone: [phone] (home)       All above HIPAA information was verified with patient.     Was a Nurse, learning disability used for this call? No    Completed refill call assessment today to schedule patient's medication shipment from the [facility] and Home Delivery Pharmacy  [phone]).  All relevant notes have been reviewed.     Specialty medication(s) and dose(s) confirmed: Regimen is correct and unchanged.   Changes to medications: Donna Levine reports no changes at this time.  Changes to insurance: No  New side effects reported not previously addressed with a pharmacist or physician: None reported  Questions for the pharmacist: No    Confirmed patient received a Conservation officer, historic buildings and a Surveyor, mining with first shipment. The patient will receive a drug information handout for each medication shipped and additional FDA Medication Guides as required.       DISEASE/MEDICATION-SPECIFIC INFORMATION        N/A    SPECIALTY MEDICATION ADHERENCE     Medication Adherence    Patient reported X missed doses in the last month: 0  Specialty Medication: upadacitinib : RINVOQ  15 mg Tb24  Patient is on additional specialty medications: No              Were doses missed due to medication being on hold? No     upadacitinib : RINVOQ  15 mg Tb24: 4 doses of medicine on hand       REFERRAL TO PHARMACIST     Referral to the pharmacist: Not needed      [facility]     Shipping address confirmed in Epic.     Cost and Payment: Patient has a $0 copay, payment information is not required.    Delivery Scheduled: Yes, Expected medication delivery date: 07/04/23.     Medication will be delivered via UPS to the prescription address in Epic WAM.    Donna Levine   [facility] Specialty and Home Delivery Pharmacy  Specialty Technician

## 2023-07-03 MED FILL — RINVOQ 15 MG TABLET,EXTENDED RELEASE: ORAL | 30 days supply | Qty: 30 | Fill #0

## 2023-07-23 MED ORDER — RINVOQ 15 MG TABLET,EXTENDED RELEASE
ORAL_TABLET | Freq: Every day | ORAL | 0 refills | 30.00000 days
Start: 2023-07-23 — End: ?

## 2023-07-23 MED ORDER — UPADACITINIB ER 15 MG TABLET,EXTENDED RELEASE 24 HR
ORAL_TABLET | 0 refills | 0.00000 days
Start: 2023-07-23 — End: ?

## 2023-07-23 NOTE — Unmapped (Signed)
 [facility] Specialty and Home Delivery Pharmacy Refill Coordination Note    Specialty Medication(s) to be Shipped:   Inflammatory Disorders: Rinvoq     Other medication(s) to be shipped: No additional medications requested for fill at this time     Donna Levine, DOB: 1970/11/20  Phone: [phone] (home)       All above HIPAA information was verified with patient.     Was a Nurse, learning disability used for this call? No    Completed refill call assessment today to schedule patient's medication shipment from the [facility] and Home Delivery Pharmacy  [phone]).  All relevant notes have been reviewed.     Specialty medication(s) and dose(s) confirmed: Regimen is correct and unchanged.   Changes to medications: Donna Levine reports no changes at this time.  Changes to insurance: No  New side effects reported not previously addressed with a pharmacist or physician: None reported  Questions for the pharmacist: No    Confirmed patient received a Conservation officer, historic buildings and a Surveyor, mining with first shipment. The patient will receive a drug information handout for each medication shipped and additional FDA Medication Guides as required.       DISEASE/MEDICATION-SPECIFIC INFORMATION        N/A    SPECIALTY MEDICATION ADHERENCE     Medication Adherence    Patient reported X missed doses in the last month: 0  Specialty Medication: RINVOQ  15 mg Tb24 (upadacitinib )  Patient is on additional specialty medications: No              Were doses missed due to medication being on hold? No     RINVOQ  15 mg Tb24 (upadacitinib ): 10 days of medicine on hand       REFERRAL TO PHARMACIST     Referral to the pharmacist: Not needed      [facility]     Shipping address confirmed in Epic.     Cost and Payment: Patient has a $0 copay, payment information is not required.    Delivery Scheduled: Yes, Expected medication delivery date: 07/30/2023.  However, Rx request for refills was sent to the provider as there are none remaining.     Medication will be delivered via UPS to the prescription address in Epic WAM.    Donna Levine   [facility] Specialty and Home Delivery Pharmacy  Specialty Technician

## 2023-07-29 DIAGNOSIS — M0589 Other rheumatoid arthritis with rheumatoid factor of multiple sites: Secondary | ICD-10-CM | POA: Diagnosis not present

## 2023-07-29 DIAGNOSIS — Z79899 Other long term (current) drug therapy: Secondary | ICD-10-CM | POA: Diagnosis not present

## 2023-07-29 DIAGNOSIS — L4050 Arthropathic psoriasis, unspecified: Secondary | ICD-10-CM | POA: Diagnosis not present

## 2023-07-29 DIAGNOSIS — M8580 Other specified disorders of bone density and structure, unspecified site: Secondary | ICD-10-CM | POA: Diagnosis not present

## 2023-07-29 MED FILL — RINVOQ 15 MG TABLET,EXTENDED RELEASE: ORAL | 30 days supply | Qty: 30 | Fill #0

## 2023-08-22 MED ORDER — UPADACITINIB ER 15 MG TABLET,EXTENDED RELEASE 24 HR
ORAL_TABLET | Freq: Every day | ORAL | 0 refills | 30.00000 days
Start: 2023-08-22 — End: ?

## 2023-08-22 MED ORDER — RINVOQ 15 MG TABLET,EXTENDED RELEASE
ORAL_TABLET | Freq: Every day | ORAL | 0 refills | 30.00000 days
Start: 2023-08-22 — End: ?

## 2023-08-22 NOTE — Unmapped (Signed)
 [facility] Specialty and Home Delivery Pharmacy Refill Coordination Note    Specialty Medication(s) to be Shipped:   Inflammatory Disorders: Rinvoq     Other medication(s) to be shipped: No additional medications requested for fill at this time     Donna Levine, DOB: 18-Dec-1970  Phone: [phone] (home)       All above HIPAA information was verified with patient.     Was a Nurse, learning disability used for this call? No    Completed refill call assessment today to schedule patient's medication shipment from the [facility] and Home Delivery Pharmacy  [phone]).  All relevant notes have been reviewed.     Specialty medication(s) and dose(s) confirmed: Regimen is correct and unchanged.   Changes to medications: Cris reports no changes at this time.  Changes to insurance: No  New side effects reported not previously addressed with a pharmacist or physician: None reported  Questions for the pharmacist: No    Confirmed patient received a Conservation officer, historic buildings and a Surveyor, mining with first shipment. The patient will receive a drug information handout for each medication shipped and additional FDA Medication Guides as required.       DISEASE/MEDICATION-SPECIFIC INFORMATION        N/A    SPECIALTY MEDICATION ADHERENCE     Medication Adherence    Patient reported X missed doses in the last month: 0  Specialty Medication: RINVOQ  15 mg Tb24 (upadacitinib )  Patient is on additional specialty medications: No              Were doses missed due to medication being on hold? No     RINVOQ  15 mg Tb24 (upadacitinib ): 6 days of medicine on hand       REFERRAL TO PHARMACIST     Referral to the pharmacist: Not needed      [facility]     Shipping address confirmed in Epic.     Cost and Payment: Patient has a $0 copay, payment information is not required.    Delivery Scheduled: Yes, Expected medication delivery date: 08/27/2023.  However, Rx request for refills was sent to the provider as there are none remaining.     Medication will be delivered via UPS to the prescription address in Epic WAM.    Donna Levine   [facility] Specialty and Home Delivery Pharmacy  Specialty Technician

## 2023-08-26 MED FILL — RINVOQ 15 MG TABLET,EXTENDED RELEASE: ORAL | 30 days supply | Qty: 30 | Fill #0

## 2023-09-18 MED ORDER — UPADACITINIB ER 15 MG TABLET,EXTENDED RELEASE 24 HR
ORAL_TABLET | Freq: Every day | ORAL | 0 refills | 30.00000 days
Start: 2023-09-18 — End: ?

## 2023-09-18 MED ORDER — RINVOQ 15 MG TABLET,EXTENDED RELEASE
ORAL_TABLET | Freq: Every day | ORAL | 0 refills | 30.00000 days
Start: 2023-09-18 — End: ?

## 2023-09-18 NOTE — Unmapped (Signed)
 [facility] Specialty and Home Delivery Pharmacy Refill Coordination Note    Specialty Medication(s) to be Shipped:   Inflammatory Disorders: Rinvoq     Other medication(s) to be shipped: No additional medications requested for fill at this time    Specialty Medications not needed at this time: N/A     Brenlyn Beshara, DOB: 03-29-1970  Phone: [phone] (home)       All above HIPAA information was verified with patient.     Was a Nurse, learning disability used for this call? No    Completed refill call assessment today to schedule patient's medication shipment from the [facility] and Home Delivery Pharmacy  [phone]).  All relevant notes have been reviewed.     Specialty medication(s) and dose(s) confirmed: Regimen is correct and unchanged.   Changes to medications: Rosmary reports no changes at this time.  Changes to insurance: No  New side effects reported not previously addressed with a pharmacist or physician: None reported  Questions for the pharmacist: No    Confirmed patient received a Conservation officer, historic buildings and a Surveyor, mining with first shipment. The patient will receive a drug information handout for each medication shipped and additional FDA Medication Guides as required.       DISEASE/MEDICATION-SPECIFIC INFORMATION        N/A    SPECIALTY MEDICATION ADHERENCE     Medication Adherence    Patient reported X missed doses in the last month: 0  Specialty Medication: RINVOQ  15 mg Tb24 (upadacitinib )  Patient is on additional specialty medications: No              Were doses missed due to medication being on hold? No      RINVOQ  15 mg Tb24 (upadacitinib ): 7 days of medicine on hand       REFERRAL TO PHARMACIST     Referral to the pharmacist: Not needed      [facility]     Shipping address confirmed in Epic.     Cost and Payment: Patient has a $0 copay, payment information is not required.    Delivery Scheduled: Yes, Expected medication delivery date: 8.20.25 . Pt aware that refill request for Rinvoq  is sent to provider.    Medication will be delivered via UPS to the prescription address in Epic WAM.    Doyal Hurst   [facility] Specialty and Home Delivery Pharmacy  Specialty Technician

## 2023-09-22 DIAGNOSIS — H25013 Cortical age-related cataract, bilateral: Secondary | ICD-10-CM | POA: Diagnosis not present

## 2023-09-22 DIAGNOSIS — E119 Type 2 diabetes mellitus without complications: Secondary | ICD-10-CM | POA: Diagnosis not present

## 2023-09-22 DIAGNOSIS — H40013 Open angle with borderline findings, low risk, bilateral: Secondary | ICD-10-CM | POA: Diagnosis not present

## 2023-09-23 MED FILL — RINVOQ 15 MG TABLET,EXTENDED RELEASE: ORAL | 30 days supply | Qty: 30 | Fill #0

## 2023-10-22 MED ORDER — RINVOQ 15 MG TABLET,EXTENDED RELEASE
ORAL_TABLET | Freq: Every day | ORAL | 0 refills | 30.00000 days
Start: 2023-10-22 — End: ?

## 2023-10-22 NOTE — Unmapped (Signed)
 [facility] Specialty and Home Delivery Pharmacy Refill Coordination Note    Specialty Medication(s) to be Shipped:   Inflammatory Disorders: Rinvoq     Other medication(s) to be shipped: No additional medications requested for fill at this time    Specialty Medications not needed at this time: N/A     Donna Levine, DOB: 09-Feb-1970  Phone: [phone] (home)       All above HIPAA information was verified with patient.     Was a Nurse, learning disability used for this call? No    Completed refill call assessment today to schedule patient's medication shipment from the [facility] and Home Delivery Pharmacy  [phone]).  All relevant notes have been reviewed.     Specialty medication(s) and dose(s) confirmed: Regimen is correct and unchanged.   Changes to medications: Alaila reports no changes at this time.  Changes to insurance: No  New side effects reported not previously addressed with a pharmacist or physician: None reported  Questions for the pharmacist: No    Confirmed patient received a Conservation officer, historic buildings and a Surveyor, mining with first shipment. The patient will receive a drug information handout for each medication shipped and additional FDA Medication Guides as required.       DISEASE/MEDICATION-SPECIFIC INFORMATION        N/A    SPECIALTY MEDICATION ADHERENCE     Medication Adherence    Patient reported X missed doses in the last month: 0  Specialty Medication: upadacitinib : RINVOQ  15 mg Tb24  Patient is on additional specialty medications: No              Were doses missed due to medication being on hold? No     upadacitinib : RINVOQ  15 mg Tb24: 7 days of medicine on hand       REFERRAL TO PHARMACIST     Referral to the pharmacist: Not needed      [facility]     Shipping address confirmed in Epic.     Cost and Payment: Patient has a $0 copay, payment information is not required.    Delivery Scheduled: Yes, Expected medication delivery date: 10/28/2023.     Medication will be delivered via UPS to the prescription address in Epic WAM.    Donna Levine   [facility] Specialty and Home Delivery Pharmacy  Specialty Technician

## 2023-10-23 MED ORDER — UPADACITINIB ER 15 MG TABLET,EXTENDED RELEASE 24 HR
ORAL_TABLET | Freq: Every day | ORAL | 0 refills | 30.00000 days
Start: 2023-10-23 — End: ?

## 2023-10-23 MED ORDER — RINVOQ 15 MG TABLET,EXTENDED RELEASE
ORAL_TABLET | Freq: Every day | ORAL | 0 refills | 30.00000 days
Start: 2023-10-23 — End: ?

## 2023-10-24 DIAGNOSIS — M069 Rheumatoid arthritis, unspecified: Principal | ICD-10-CM

## 2023-10-25 DIAGNOSIS — H66002 Acute suppurative otitis media without spontaneous rupture of ear drum, left ear: Secondary | ICD-10-CM | POA: Diagnosis not present

## 2023-10-25 DIAGNOSIS — M1711 Unilateral primary osteoarthritis, right knee: Secondary | ICD-10-CM | POA: Diagnosis not present

## 2023-10-25 DIAGNOSIS — M069 Rheumatoid arthritis, unspecified: Secondary | ICD-10-CM | POA: Diagnosis not present

## 2023-10-31 DIAGNOSIS — M1990 Unspecified osteoarthritis, unspecified site: Secondary | ICD-10-CM | POA: Diagnosis not present

## 2023-10-31 DIAGNOSIS — M0589 Other rheumatoid arthritis with rheumatoid factor of multiple sites: Secondary | ICD-10-CM | POA: Diagnosis not present

## 2023-10-31 DIAGNOSIS — L4050 Arthropathic psoriasis, unspecified: Secondary | ICD-10-CM | POA: Diagnosis not present

## 2023-10-31 DIAGNOSIS — Z79899 Other long term (current) drug therapy: Secondary | ICD-10-CM | POA: Diagnosis not present

## 2023-10-31 MED ORDER — RINVOQ 15 MG TABLET,EXTENDED RELEASE
ORAL_TABLET | Freq: Every day | ORAL | 3 refills | 90.00000 days
Start: 2023-10-31 — End: ?

## 2023-11-05 NOTE — Unmapped (Signed)
 [facility] Specialty and Home Delivery Pharmacy Refill Coordination Note    Specialty Medication(s) to be Shipped:   Inflammatory Disorders: Rinvoq     Other medication(s) to be shipped: No additional medications requested for fill at this time    Specialty Medications not needed at this time: N/A     Donna Levine, DOB: [birth year]  Phone: [phone] (home)       All above HIPAA information was verified with patient.     Was a Nurse, learning disability used for this call? No    Completed refill call assessment today to schedule patient's medication shipment from the [facility] and Home Delivery Pharmacy  [phone]).  All relevant notes have been reviewed.     Specialty medication(s) and dose(s) confirmed: Regimen is correct and unchanged.   Changes to medications: Madicyn reports no changes at this time.  Changes to insurance: No  New side effects reported not previously addressed with a pharmacist or physician: None reported  Questions for the pharmacist: No    Confirmed patient received a Conservation officer, historic buildings and a Surveyor, mining with first shipment. The patient will receive a drug information handout for each medication shipped and additional FDA Medication Guides as required.       DISEASE/MEDICATION-SPECIFIC INFORMATION        N/A    SPECIALTY MEDICATION ADHERENCE     Medication Adherence    Patient reported X missed doses in the last month: 0  Specialty Medication: RINVOQ  15 mg Tb24 (upadacitinib )  Patient is on additional specialty medications: No              Were doses missed due to medication being on hold? No      RINVOQ  15 mg Tb24 (upadacitinib ): 1 doses of medicine on hand       REFERRAL TO PHARMACIST     Referral to the pharmacist: Not needed      [facility]     Shipping address confirmed in Epic.     Cost and Payment: Patient has a $0 copay, payment information is not required.    Delivery Scheduled: Yes, Expected medication delivery date: 11/07/2023.     Medication will be delivered via UPS to the prescription address in Epic WAM.     Laymon Duty   [facility] Specialty and Home Delivery Pharmacy  Specialty Technician

## 2023-11-06 NOTE — Unmapped (Signed)
 Donna Levine 's Rinvoq  shipment will be canceled as a result of no longer being eligible to fill at [facility] pharmacy.     I have reached out to the patient  at [phone] and communicated the delay. We will call the Clinic and inform them to re-route the Rx to the preferred Pharmacy - [facility] - Phone:[phone] Fax: [phone]. We have canceled this work request.

## 2023-11-06 NOTE — Unmapped (Signed)
 Specialty Medication(s): Rinvoq     Ms.Caputi has been dis-enrolled from the [facility] Specialty and Home Delivery Pharmacy specialty pharmacy services as a result of a pharmacy change resulting from insurance limitations. The insurance company requires the patient fill at [facility].    Additional information provided to the patient: [facility] - [phone] Fax: [phone]     Velma Ober, PharmD  [facility] Specialty and Home Delivery Pharmacy Specialty Pharmacist

## 2024-05-13 ENCOUNTER — Institutional Professional Consult (permissible substitution) (HOSPITAL_BASED_OUTPATIENT_CLINIC_OR_DEPARTMENT_OTHER): Admitting: Internal Medicine
